# Patient Record
Sex: Female | Born: 1955 | Race: Black or African American | Hispanic: No | Marital: Married | Smoking: Former smoker
Health system: Southern US, Community
[De-identification: ages and names within clinical notes are randomized; demographics above are authoritative.]

## PROBLEM LIST (undated history)

## (undated) DIAGNOSIS — T8859XA Other complications of anesthesia, initial encounter: Secondary | ICD-10-CM

## (undated) DIAGNOSIS — I82409 Acute embolism and thrombosis of unspecified deep veins of unspecified lower extremity: Secondary | ICD-10-CM

## (undated) DIAGNOSIS — H6690 Otitis media, unspecified, unspecified ear: Secondary | ICD-10-CM

## (undated) DIAGNOSIS — L0390 Cellulitis, unspecified: Secondary | ICD-10-CM

## (undated) DIAGNOSIS — G4733 Obstructive sleep apnea (adult) (pediatric): Secondary | ICD-10-CM

## (undated) DIAGNOSIS — Z9989 Dependence on other enabling machines and devices: Secondary | ICD-10-CM

## (undated) DIAGNOSIS — M3500 Sicca syndrome, unspecified: Secondary | ICD-10-CM

## (undated) DIAGNOSIS — I10 Essential (primary) hypertension: Secondary | ICD-10-CM

## (undated) DIAGNOSIS — T4145XA Adverse effect of unspecified anesthetic, initial encounter: Secondary | ICD-10-CM

## (undated) DIAGNOSIS — K219 Gastro-esophageal reflux disease without esophagitis: Secondary | ICD-10-CM

## (undated) DIAGNOSIS — M1990 Unspecified osteoarthritis, unspecified site: Secondary | ICD-10-CM

## (undated) HISTORY — PX: DILATION AND CURETTAGE OF UTERUS: SHX78

## (undated) HISTORY — PX: CARPAL TUNNEL RELEASE: SHX101

## (undated) HISTORY — DX: Sjogren syndrome, unspecified: M35.00

## (undated) HISTORY — DX: Otitis media, unspecified, unspecified ear: H66.90

## (undated) HISTORY — DX: Acute embolism and thrombosis of unspecified deep veins of unspecified lower extremity: I82.409

## (undated) HISTORY — DX: Obstructive sleep apnea (adult) (pediatric): G47.33

## (undated) HISTORY — DX: Essential (primary) hypertension: I10

## (undated) HISTORY — PX: CERVICAL BIOPSY: SHX590

---

## 1987-12-13 DIAGNOSIS — I82409 Acute embolism and thrombosis of unspecified deep veins of unspecified lower extremity: Secondary | ICD-10-CM

## 1987-12-13 HISTORY — DX: Acute embolism and thrombosis of unspecified deep veins of unspecified lower extremity: I82.409

## 1988-12-12 HISTORY — PX: TUBAL LIGATION: SHX77

## 1994-12-12 HISTORY — PX: LAPAROSCOPIC HYSTERECTOMY: SHX1926

## 1994-12-12 HISTORY — PX: ABDOMINAL HYSTERECTOMY: SHX81

## 1994-12-12 HISTORY — PX: BLADDER SURGERY: SHX569

## 1995-12-13 HISTORY — PX: OSTOMY TAKE DOWN: SHX2142

## 1998-06-17 ENCOUNTER — Encounter: Admission: RE | Admit: 1998-06-17 | Discharge: 1998-06-17 | Payer: Self-pay | Admitting: Family Medicine

## 1998-06-29 ENCOUNTER — Encounter: Admission: RE | Admit: 1998-06-29 | Discharge: 1998-06-29 | Payer: Self-pay | Admitting: Family Medicine

## 1998-07-03 ENCOUNTER — Encounter: Admission: RE | Admit: 1998-07-03 | Discharge: 1998-07-03 | Payer: Self-pay | Admitting: Family Medicine

## 1999-01-27 ENCOUNTER — Encounter: Admission: RE | Admit: 1999-01-27 | Discharge: 1999-01-27 | Payer: Self-pay | Admitting: Family Medicine

## 1999-02-25 ENCOUNTER — Ambulatory Visit (HOSPITAL_BASED_OUTPATIENT_CLINIC_OR_DEPARTMENT_OTHER): Admission: RE | Admit: 1999-02-25 | Discharge: 1999-02-25 | Payer: Self-pay | Admitting: Orthopedic Surgery

## 1999-08-13 HISTORY — PX: FOOT SURGERY: SHX648

## 1999-09-01 ENCOUNTER — Encounter: Admission: RE | Admit: 1999-09-01 | Discharge: 1999-09-01 | Payer: Self-pay | Admitting: Family Medicine

## 2002-01-19 ENCOUNTER — Emergency Department (HOSPITAL_COMMUNITY): Admission: EM | Admit: 2002-01-19 | Discharge: 2002-01-19 | Payer: Self-pay | Admitting: Emergency Medicine

## 2002-10-27 ENCOUNTER — Emergency Department (HOSPITAL_COMMUNITY): Admission: EM | Admit: 2002-10-27 | Discharge: 2002-10-27 | Payer: Self-pay | Admitting: Emergency Medicine

## 2002-12-17 ENCOUNTER — Encounter: Admission: RE | Admit: 2002-12-17 | Discharge: 2002-12-17 | Payer: Self-pay | Admitting: Family Medicine

## 2002-12-17 ENCOUNTER — Other Ambulatory Visit: Admission: RE | Admit: 2002-12-17 | Discharge: 2002-12-17 | Payer: Self-pay | Admitting: Family Medicine

## 2002-12-19 ENCOUNTER — Encounter: Admission: RE | Admit: 2002-12-19 | Discharge: 2002-12-19 | Payer: Self-pay | Admitting: Family Medicine

## 2002-12-20 ENCOUNTER — Encounter: Admission: RE | Admit: 2002-12-20 | Discharge: 2002-12-20 | Payer: Self-pay | Admitting: Sports Medicine

## 2002-12-20 ENCOUNTER — Encounter: Payer: Self-pay | Admitting: Sports Medicine

## 2002-12-31 ENCOUNTER — Encounter: Admission: RE | Admit: 2002-12-31 | Discharge: 2002-12-31 | Payer: Self-pay | Admitting: Family Medicine

## 2003-01-01 ENCOUNTER — Encounter: Payer: Self-pay | Admitting: Sports Medicine

## 2003-01-01 ENCOUNTER — Encounter: Admission: RE | Admit: 2003-01-01 | Discharge: 2003-01-01 | Payer: Self-pay | Admitting: Sports Medicine

## 2003-01-09 ENCOUNTER — Encounter: Payer: Self-pay | Admitting: Internal Medicine

## 2003-01-09 ENCOUNTER — Ambulatory Visit (HOSPITAL_BASED_OUTPATIENT_CLINIC_OR_DEPARTMENT_OTHER): Admission: RE | Admit: 2003-01-09 | Discharge: 2003-01-09 | Payer: Self-pay

## 2003-01-09 DIAGNOSIS — G4733 Obstructive sleep apnea (adult) (pediatric): Secondary | ICD-10-CM

## 2003-01-09 HISTORY — DX: Obstructive sleep apnea (adult) (pediatric): G47.33

## 2003-01-14 ENCOUNTER — Encounter: Admission: RE | Admit: 2003-01-14 | Discharge: 2003-01-14 | Payer: Self-pay | Admitting: Family Medicine

## 2003-04-11 ENCOUNTER — Ambulatory Visit (HOSPITAL_BASED_OUTPATIENT_CLINIC_OR_DEPARTMENT_OTHER): Admission: RE | Admit: 2003-04-11 | Discharge: 2003-04-11 | Payer: Self-pay | Admitting: Orthopedic Surgery

## 2003-05-23 ENCOUNTER — Ambulatory Visit (HOSPITAL_BASED_OUTPATIENT_CLINIC_OR_DEPARTMENT_OTHER): Admission: RE | Admit: 2003-05-23 | Discharge: 2003-05-23 | Payer: Self-pay | Admitting: Orthopedic Surgery

## 2003-06-04 ENCOUNTER — Encounter: Payer: Self-pay | Admitting: Internal Medicine

## 2003-06-04 ENCOUNTER — Ambulatory Visit (HOSPITAL_BASED_OUTPATIENT_CLINIC_OR_DEPARTMENT_OTHER): Admission: RE | Admit: 2003-06-04 | Discharge: 2003-06-04 | Payer: Self-pay | Admitting: Internal Medicine

## 2003-07-08 ENCOUNTER — Encounter: Admission: RE | Admit: 2003-07-08 | Discharge: 2003-07-08 | Payer: Self-pay | Admitting: Family Medicine

## 2003-07-08 ENCOUNTER — Encounter: Payer: Self-pay | Admitting: Sports Medicine

## 2003-07-08 ENCOUNTER — Encounter: Admission: RE | Admit: 2003-07-08 | Discharge: 2003-07-08 | Payer: Self-pay | Admitting: Sports Medicine

## 2003-07-16 ENCOUNTER — Encounter: Admission: RE | Admit: 2003-07-16 | Discharge: 2003-07-16 | Payer: Self-pay | Admitting: Family Medicine

## 2003-11-12 HISTORY — PX: CARPAL TUNNEL RELEASE: SHX101

## 2003-11-27 ENCOUNTER — Encounter: Admission: RE | Admit: 2003-11-27 | Discharge: 2003-11-27 | Payer: Self-pay | Admitting: Family Medicine

## 2003-11-28 ENCOUNTER — Ambulatory Visit (HOSPITAL_BASED_OUTPATIENT_CLINIC_OR_DEPARTMENT_OTHER): Admission: RE | Admit: 2003-11-28 | Discharge: 2003-11-28 | Payer: Self-pay | Admitting: Orthopedic Surgery

## 2003-11-28 ENCOUNTER — Encounter (INDEPENDENT_AMBULATORY_CARE_PROVIDER_SITE_OTHER): Payer: Self-pay | Admitting: Specialist

## 2003-11-28 ENCOUNTER — Ambulatory Visit (HOSPITAL_COMMUNITY): Admission: RE | Admit: 2003-11-28 | Discharge: 2003-11-28 | Payer: Self-pay | Admitting: Orthopedic Surgery

## 2003-12-19 ENCOUNTER — Encounter: Admission: RE | Admit: 2003-12-19 | Discharge: 2003-12-19 | Payer: Self-pay | Admitting: Family Medicine

## 2004-02-05 ENCOUNTER — Encounter: Admission: RE | Admit: 2004-02-05 | Discharge: 2004-02-05 | Payer: Self-pay | Admitting: Family Medicine

## 2004-02-19 ENCOUNTER — Encounter: Admission: RE | Admit: 2004-02-19 | Discharge: 2004-02-19 | Payer: Self-pay | Admitting: Sports Medicine

## 2004-04-05 ENCOUNTER — Encounter: Admission: RE | Admit: 2004-04-05 | Discharge: 2004-04-05 | Payer: Self-pay | Admitting: Family Medicine

## 2004-05-27 ENCOUNTER — Encounter: Admission: RE | Admit: 2004-05-27 | Discharge: 2004-05-27 | Payer: Self-pay | Admitting: Family Medicine

## 2005-01-27 ENCOUNTER — Ambulatory Visit: Payer: Self-pay | Admitting: Family Medicine

## 2005-07-13 ENCOUNTER — Ambulatory Visit: Payer: Self-pay | Admitting: Family Medicine

## 2005-07-17 ENCOUNTER — Encounter (INDEPENDENT_AMBULATORY_CARE_PROVIDER_SITE_OTHER): Payer: Self-pay | Admitting: *Deleted

## 2005-07-17 LAB — CONVERTED CEMR LAB

## 2005-08-04 ENCOUNTER — Ambulatory Visit: Payer: Self-pay | Admitting: Sports Medicine

## 2005-08-09 ENCOUNTER — Ambulatory Visit: Payer: Self-pay | Admitting: Sports Medicine

## 2005-10-19 ENCOUNTER — Ambulatory Visit: Payer: Self-pay | Admitting: Family Medicine

## 2005-10-25 ENCOUNTER — Ambulatory Visit: Payer: Self-pay | Admitting: Family Medicine

## 2006-01-05 ENCOUNTER — Ambulatory Visit: Payer: Self-pay | Admitting: Family Medicine

## 2006-02-07 ENCOUNTER — Ambulatory Visit: Payer: Self-pay | Admitting: Sports Medicine

## 2006-02-14 ENCOUNTER — Ambulatory Visit: Payer: Self-pay | Admitting: Family Medicine

## 2006-02-22 ENCOUNTER — Encounter: Admission: RE | Admit: 2006-02-22 | Discharge: 2006-02-22 | Payer: Self-pay | Admitting: *Deleted

## 2006-07-12 HISTORY — PX: SALIVARY GLAND SURGERY: SHX768

## 2006-07-12 HISTORY — PX: MYRINGOTOMY WITH TUBE PLACEMENT: SHX5663

## 2006-08-02 ENCOUNTER — Encounter (INDEPENDENT_AMBULATORY_CARE_PROVIDER_SITE_OTHER): Payer: Self-pay | Admitting: Specialist

## 2006-08-02 ENCOUNTER — Ambulatory Visit (HOSPITAL_COMMUNITY): Admission: RE | Admit: 2006-08-02 | Discharge: 2006-08-02 | Payer: Self-pay | Admitting: Otolaryngology

## 2007-02-08 DIAGNOSIS — G4730 Sleep apnea, unspecified: Secondary | ICD-10-CM

## 2007-02-08 DIAGNOSIS — I10 Essential (primary) hypertension: Secondary | ICD-10-CM

## 2007-02-08 DIAGNOSIS — N951 Menopausal and female climacteric states: Secondary | ICD-10-CM

## 2007-02-08 DIAGNOSIS — R42 Dizziness and giddiness: Secondary | ICD-10-CM | POA: Insufficient documentation

## 2007-02-09 ENCOUNTER — Encounter (INDEPENDENT_AMBULATORY_CARE_PROVIDER_SITE_OTHER): Payer: Self-pay | Admitting: *Deleted

## 2007-02-26 ENCOUNTER — Encounter (INDEPENDENT_AMBULATORY_CARE_PROVIDER_SITE_OTHER): Payer: Self-pay | Admitting: Family Medicine

## 2007-02-26 ENCOUNTER — Ambulatory Visit: Payer: Self-pay | Admitting: Family Medicine

## 2007-02-26 DIAGNOSIS — E785 Hyperlipidemia, unspecified: Secondary | ICD-10-CM

## 2007-02-26 LAB — CONVERTED CEMR LAB
Glucose, Urine, Semiquant: NEGATIVE
Ketones, urine, test strip: NEGATIVE
Urobilinogen, UA: 0.2
WBC Urine, dipstick: NEGATIVE
pH: 5.5

## 2007-02-27 IMAGING — CR DG CHEST 2V
2 series · 2 of 2 positions shown · non-contrast
Comparison: none

CLINICAL DATA: Carotid mass. Chronic otitis media. Sleep apnea. Preoperative chest.
 CHEST - 2 VIEW:

[view not recorded (1 of 2)]
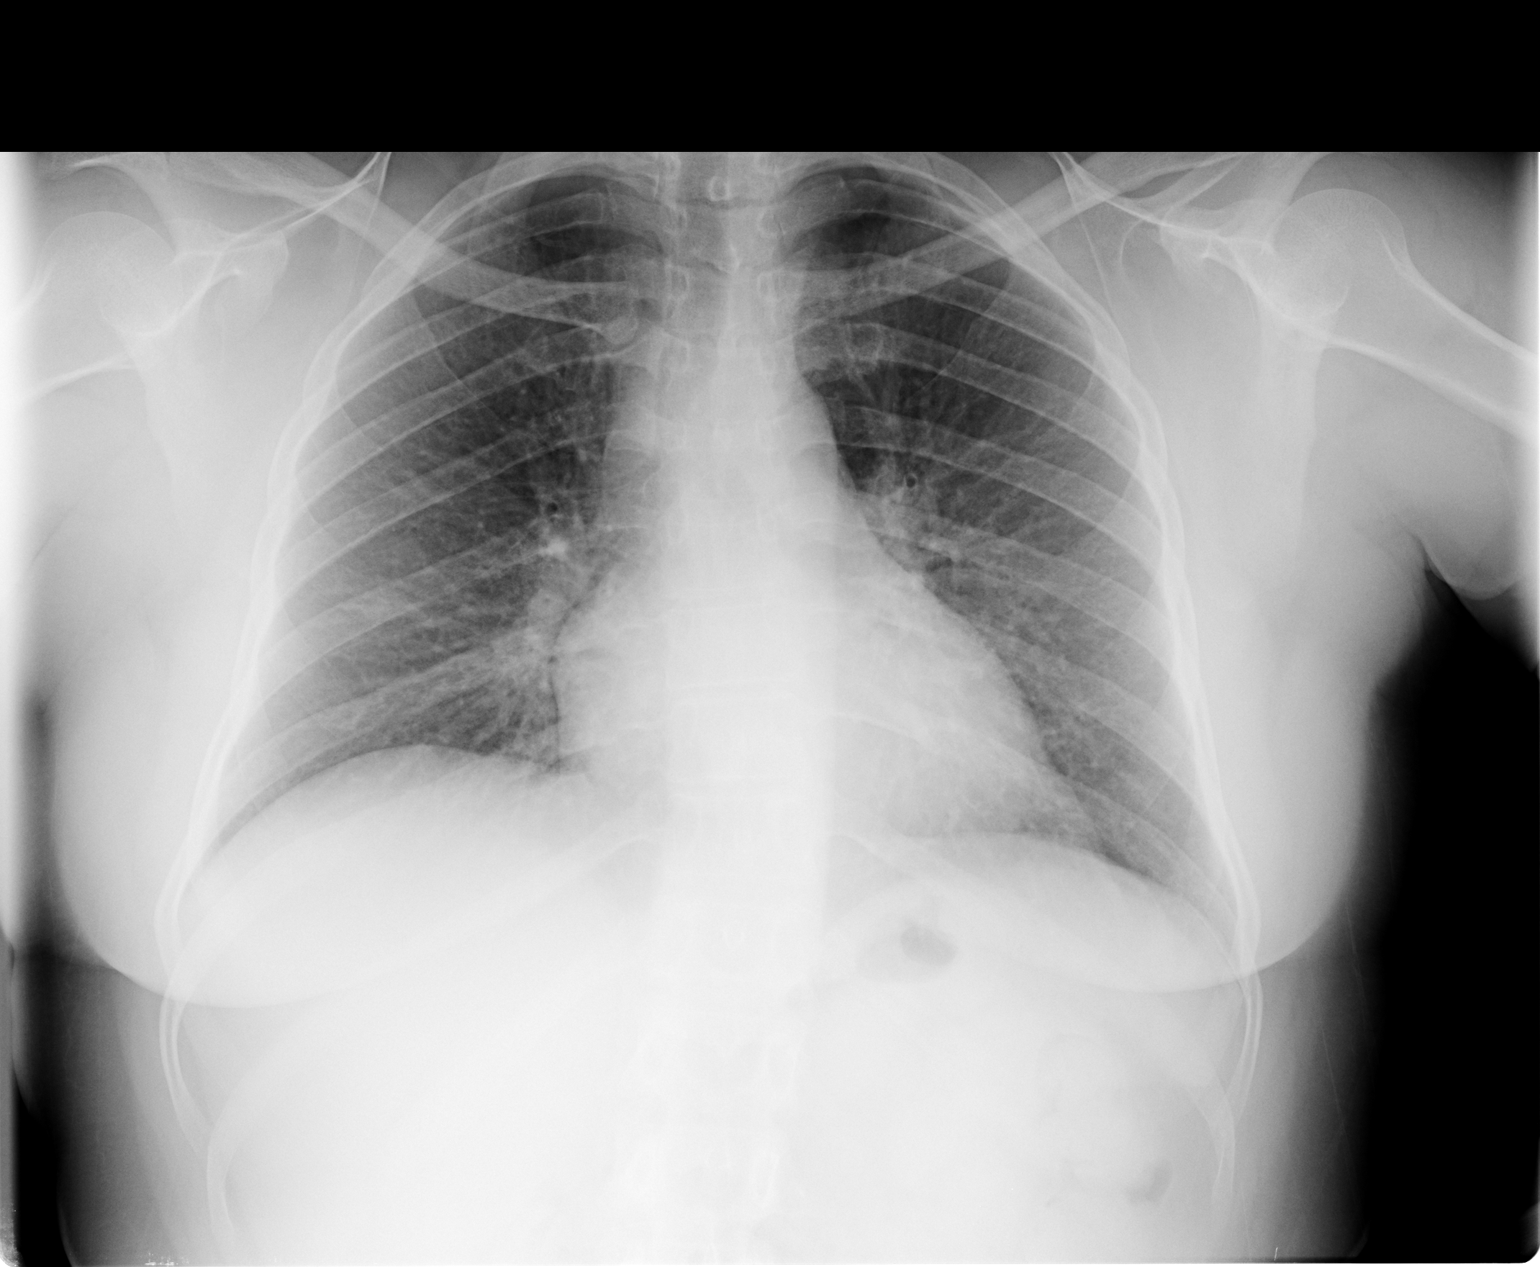

[view not recorded (2 of 2)]
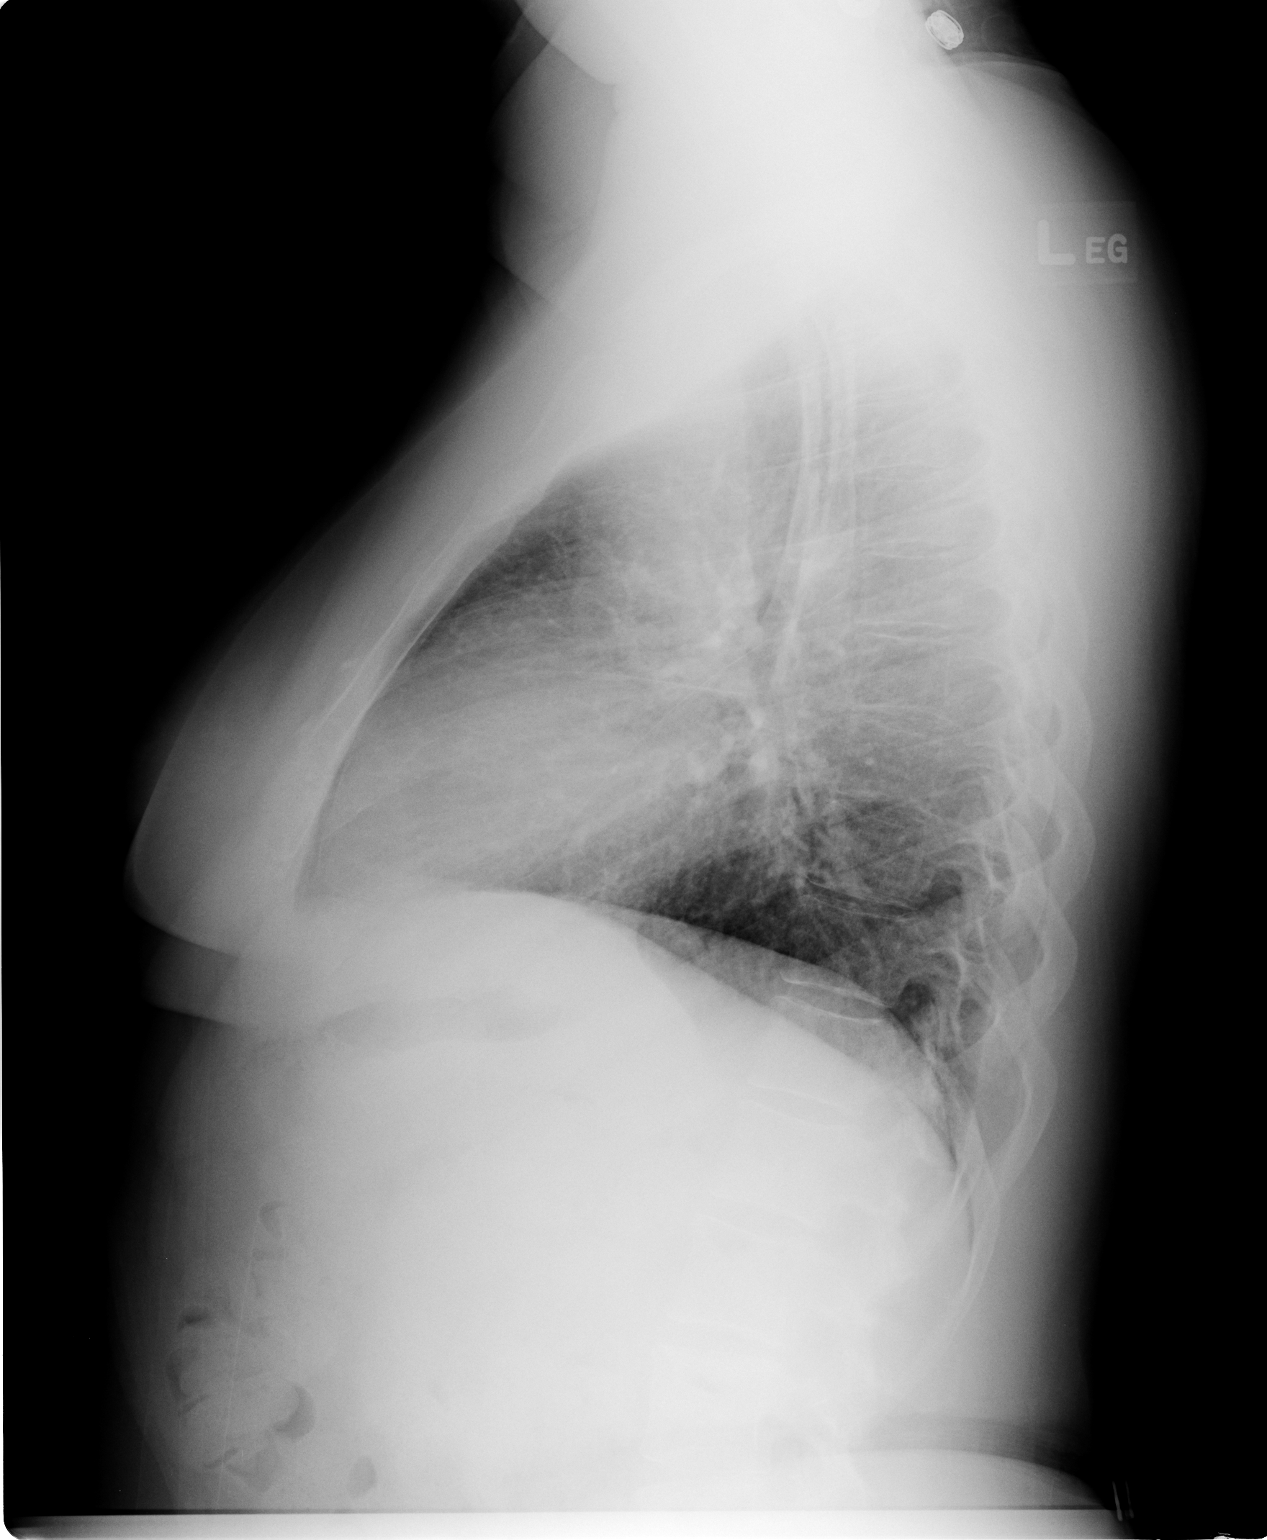

[2 of 2 positions shown; findings below may reference images not displayed]

FINDINGS: There are mildly accentuated bronchial markings. There are no infiltrates.  The heart and mediastinal structures are normal.
IMPRESSION: Mildly accentuated bronchial markings.  No evidence for active chest disease.

## 2007-02-28 ENCOUNTER — Encounter (INDEPENDENT_AMBULATORY_CARE_PROVIDER_SITE_OTHER): Payer: Self-pay | Admitting: Family Medicine

## 2007-02-28 ENCOUNTER — Telehealth (INDEPENDENT_AMBULATORY_CARE_PROVIDER_SITE_OTHER): Payer: Self-pay | Admitting: Family Medicine

## 2007-02-28 LAB — CONVERTED CEMR LAB
AST: 27 units/L (ref 0–37)
Albumin: 4.5 g/dL (ref 3.5–5.2)
Alkaline Phosphatase: 90 units/L (ref 39–117)
BUN: 11 mg/dL (ref 6–23)
Creatinine, Ser: 0.86 mg/dL (ref 0.40–1.20)
Glucose, Bld: 107 mg/dL — ABNORMAL HIGH (ref 70–99)
HDL: 44 mg/dL (ref >39)
LDL Cholesterol: 159 mg/dL — ABNORMAL HIGH (ref 0–99)
Potassium: 4.2 meq/L (ref 3.5–5.3)
Total Bilirubin: 1.1 mg/dL (ref 0.3–1.2)
Total CHOL/HDL Ratio: 5.1
Triglycerides: 110 mg/dL (ref <150)
VLDL: 22 mg/dL (ref 0–40)

## 2007-03-01 ENCOUNTER — Telehealth (INDEPENDENT_AMBULATORY_CARE_PROVIDER_SITE_OTHER): Payer: Self-pay | Admitting: Family Medicine

## 2007-03-13 ENCOUNTER — Ambulatory Visit: Payer: Self-pay | Admitting: Sports Medicine

## 2007-03-13 ENCOUNTER — Encounter (INDEPENDENT_AMBULATORY_CARE_PROVIDER_SITE_OTHER): Payer: Self-pay | Admitting: Family Medicine

## 2007-03-13 LAB — CONVERTED CEMR LAB
CO2: 26 meq/L (ref 19–32)
Calcium: 9.3 mg/dL (ref 8.4–10.5)
Creatinine, Ser: 0.88 mg/dL (ref 0.40–1.20)
Sodium: 142 meq/L (ref 135–145)

## 2007-03-26 ENCOUNTER — Encounter (INDEPENDENT_AMBULATORY_CARE_PROVIDER_SITE_OTHER): Payer: Self-pay | Admitting: Family Medicine

## 2007-03-26 ENCOUNTER — Encounter: Admission: RE | Admit: 2007-03-26 | Discharge: 2007-03-26 | Payer: Self-pay | Admitting: Family Medicine

## 2007-03-28 ENCOUNTER — Encounter (INDEPENDENT_AMBULATORY_CARE_PROVIDER_SITE_OTHER): Payer: Self-pay | Admitting: Family Medicine

## 2007-03-28 LAB — HM MAMMOGRAPHY

## 2007-04-17 ENCOUNTER — Ambulatory Visit: Payer: Self-pay | Admitting: Family Medicine

## 2007-04-18 ENCOUNTER — Telehealth: Payer: Self-pay | Admitting: *Deleted

## 2007-05-29 ENCOUNTER — Telehealth: Payer: Self-pay | Admitting: *Deleted

## 2007-06-05 ENCOUNTER — Encounter (INDEPENDENT_AMBULATORY_CARE_PROVIDER_SITE_OTHER): Payer: Self-pay | Admitting: Family Medicine

## 2007-06-08 ENCOUNTER — Telehealth (INDEPENDENT_AMBULATORY_CARE_PROVIDER_SITE_OTHER): Payer: Self-pay | Admitting: Family Medicine

## 2008-07-18 ENCOUNTER — Ambulatory Visit: Payer: Self-pay | Admitting: Family Medicine

## 2008-07-18 ENCOUNTER — Telehealth: Payer: Self-pay | Admitting: *Deleted

## 2008-07-18 LAB — CONVERTED CEMR LAB
Glucose, Urine, Semiquant: NEGATIVE
Ketones, urine, test strip: NEGATIVE
Nitrite: NEGATIVE
Specific Gravity, Urine: 1.02
pH: 7

## 2008-07-19 ENCOUNTER — Encounter: Payer: Self-pay | Admitting: Family Medicine

## 2008-08-20 ENCOUNTER — Telehealth: Payer: Self-pay | Admitting: *Deleted

## 2008-08-20 ENCOUNTER — Encounter: Payer: Self-pay | Admitting: Family Medicine

## 2008-08-20 ENCOUNTER — Ambulatory Visit: Payer: Self-pay | Admitting: Family Medicine

## 2008-08-20 LAB — CONVERTED CEMR LAB
Bilirubin Urine: NEGATIVE
Ketones, urine, test strip: NEGATIVE
Nitrite: POSITIVE
Protein, U semiquant: NEGATIVE
Urobilinogen, UA: 0.2

## 2008-09-15 ENCOUNTER — Encounter (INDEPENDENT_AMBULATORY_CARE_PROVIDER_SITE_OTHER): Payer: Self-pay | Admitting: Family Medicine

## 2008-10-21 ENCOUNTER — Ambulatory Visit: Payer: Self-pay | Admitting: Family Medicine

## 2008-10-21 ENCOUNTER — Telehealth (INDEPENDENT_AMBULATORY_CARE_PROVIDER_SITE_OTHER): Payer: Self-pay | Admitting: *Deleted

## 2008-10-24 ENCOUNTER — Telehealth (INDEPENDENT_AMBULATORY_CARE_PROVIDER_SITE_OTHER): Payer: Self-pay | Admitting: Family Medicine

## 2008-11-20 ENCOUNTER — Telehealth (INDEPENDENT_AMBULATORY_CARE_PROVIDER_SITE_OTHER): Payer: Self-pay | Admitting: Family Medicine

## 2008-11-20 ENCOUNTER — Emergency Department (HOSPITAL_COMMUNITY): Admission: EM | Admit: 2008-11-20 | Discharge: 2008-11-20 | Payer: Self-pay | Admitting: Emergency Medicine

## 2008-12-22 ENCOUNTER — Telehealth: Payer: Self-pay | Admitting: *Deleted

## 2009-01-13 ENCOUNTER — Telehealth: Payer: Self-pay | Admitting: *Deleted

## 2009-01-13 ENCOUNTER — Ambulatory Visit: Payer: Self-pay | Admitting: Family Medicine

## 2009-01-13 DIAGNOSIS — M1990 Unspecified osteoarthritis, unspecified site: Secondary | ICD-10-CM

## 2009-02-05 ENCOUNTER — Telehealth: Payer: Self-pay | Admitting: *Deleted

## 2009-04-09 ENCOUNTER — Ambulatory Visit: Payer: Self-pay | Admitting: Internal Medicine

## 2009-05-22 ENCOUNTER — Ambulatory Visit: Payer: Self-pay | Admitting: Internal Medicine

## 2009-06-03 ENCOUNTER — Encounter: Payer: Self-pay | Admitting: Internal Medicine

## 2009-06-10 ENCOUNTER — Telehealth: Payer: Self-pay | Admitting: Internal Medicine

## 2009-09-14 ENCOUNTER — Encounter: Payer: Self-pay | Admitting: Family Medicine

## 2009-09-14 ENCOUNTER — Ambulatory Visit: Payer: Self-pay | Admitting: Family Medicine

## 2009-09-14 LAB — CONVERTED CEMR LAB
ALT: 34 units/L (ref 0–35)
AST: 26 units/L (ref 0–37)
Albumin: 4.7 g/dL (ref 3.5–5.2)
Alkaline Phosphatase: 112 units/L (ref 39–117)
BUN: 12 mg/dL (ref 6–23)
CO2: 24 meq/L (ref 19–32)
Calcium: 9.9 mg/dL (ref 8.4–10.5)
Chloride: 104 meq/L (ref 96–112)
Creatinine, Ser: 0.84 mg/dL (ref 0.40–1.20)
Glucose, Bld: 98 mg/dL (ref 70–99)
HCT: 41.5 % (ref 36.0–46.0)
Hemoglobin: 13.7 g/dL (ref 12.0–15.0)
Lipase: 16 units/L (ref 0–75)
MCHC: 33 g/dL (ref 30.0–36.0)
MCV: 87.6 fL (ref 78.0–100.0)
Platelets: 298 10*3/uL (ref 150–400)
Potassium: 4.2 meq/L (ref 3.5–5.3)
RBC: 4.74 M/uL (ref 3.87–5.11)
RDW: 13.4 % (ref 11.5–15.5)
Sodium: 141 meq/L (ref 135–145)
Total Bilirubin: 1.7 mg/dL — ABNORMAL HIGH (ref 0.3–1.2)
Total Protein: 8.2 g/dL (ref 6.0–8.3)
WBC: 9.9 10*3/uL (ref 4.0–10.5)

## 2009-09-16 ENCOUNTER — Telehealth: Payer: Self-pay | Admitting: *Deleted

## 2009-09-16 ENCOUNTER — Encounter: Payer: Self-pay | Admitting: Family Medicine

## 2009-10-01 ENCOUNTER — Encounter: Payer: Self-pay | Admitting: Family Medicine

## 2009-10-05 ENCOUNTER — Encounter: Payer: Self-pay | Admitting: Family Medicine

## 2009-10-05 LAB — HM COLONOSCOPY

## 2009-10-09 ENCOUNTER — Telehealth: Payer: Self-pay | Admitting: Family Medicine

## 2010-01-28 ENCOUNTER — Telehealth: Payer: Self-pay | Admitting: Family Medicine

## 2010-01-28 ENCOUNTER — Ambulatory Visit: Payer: Self-pay | Admitting: Family Medicine

## 2010-05-14 ENCOUNTER — Ambulatory Visit: Payer: Self-pay | Admitting: Family Medicine

## 2010-05-14 ENCOUNTER — Encounter: Payer: Self-pay | Admitting: Family Medicine

## 2010-05-14 DIAGNOSIS — M3500 Sicca syndrome, unspecified: Secondary | ICD-10-CM

## 2010-05-14 LAB — CONVERTED CEMR LAB
CO2: 27 meq/L (ref 19–32)
Calcium: 9.7 mg/dL (ref 8.4–10.5)
Creatinine, Ser: 0.75 mg/dL (ref 0.40–1.20)

## 2010-05-24 ENCOUNTER — Encounter: Payer: Self-pay | Admitting: Family Medicine

## 2010-05-24 ENCOUNTER — Ambulatory Visit: Payer: Self-pay | Admitting: Family Medicine

## 2010-05-24 LAB — CONVERTED CEMR LAB
Chloride: 101 meq/L (ref 96–112)
Creatinine, Ser: 0.85 mg/dL (ref 0.40–1.20)
Potassium: 3.8 meq/L (ref 3.5–5.3)
Sodium: 140 meq/L (ref 135–145)

## 2010-05-25 ENCOUNTER — Encounter: Payer: Self-pay | Admitting: Family Medicine

## 2010-06-29 ENCOUNTER — Telehealth: Payer: Self-pay | Admitting: Family Medicine

## 2010-06-30 ENCOUNTER — Telehealth: Payer: Self-pay | Admitting: *Deleted

## 2010-08-23 ENCOUNTER — Encounter: Payer: Self-pay | Admitting: Family Medicine

## 2010-08-23 ENCOUNTER — Ambulatory Visit: Payer: Self-pay | Admitting: Family Medicine

## 2010-09-08 ENCOUNTER — Ambulatory Visit: Payer: Self-pay | Admitting: Family Medicine

## 2010-09-08 LAB — CONVERTED CEMR LAB: Whiff Test: POSITIVE

## 2010-09-21 ENCOUNTER — Telehealth: Payer: Self-pay | Admitting: Family Medicine

## 2010-10-15 ENCOUNTER — Encounter: Payer: Self-pay | Admitting: Family Medicine

## 2011-01-11 NOTE — Progress Notes (Signed)
  Medications Added HYDROCHLOROTHIAZIDE 25 MG TABS (HYDROCHLOROTHIAZIDE) 1 tab by mouth daily       Phone Note Call from Patient   Caller: Patient Call For: [phone] or [phone] Summary of Call: Pt need he orginal dosage of her HCTZ 25 mg to be represcribed because the 50mg  she believes is causing  severe dryness to her eyes.  She suffer with sjogrens disease and the higher dosage of the HCTZ is causing problems.   Initial call taken by: Abundio Miu,  September 21, 2010 9:43 AM    New/Updated Medications: HYDROCHLOROTHIAZIDE 25 MG TABS (HYDROCHLOROTHIAZIDE) 1 tab by mouth daily Prescriptions: HYDROCHLOROTHIAZIDE 25 MG TABS (HYDROCHLOROTHIAZIDE) 1 tab by mouth daily  #30 x 3   Entered and Authorized by:   Angelena Sole MD   Signed by:   Angelena Sole MD on 09/22/2010   Method used:   Electronically to        [facility] (retail)       [address]       [place], [place]  [id number]       Ph: [phone]       Fax: [phone]   RxID:   [id number]   Appended Document:  Pt informed

## 2011-01-11 NOTE — Assessment & Plan Note (Signed)
Summary: female problem/bp problems,tcb   Vital Signs:  Patient profile:   55 year old female Height:      61.25 inches Weight:      183 pounds BMI:     34.42 BSA:     1.83 Temp:     98.2 degrees F Pulse rate:   90 / minute BP sitting:   154 / 80  Vitals Entered By: Jone Baseman CMA (September 08, 2010 11:17 AM) CC: female problem/ BP problems Is Patient Diabetic? No Pain Assessment Patient in pain? no        Primary Care Provider:  Angelena Sole MD  CC:  female problem/ BP problems.  History of Present Illness: 1. Vaginal bleeding: - Noticed this for the past couple of months - She has noticed a small amount of bleeding after intercourse 2-3 times in the past couple of months. - It doesn't happen everytime  ROS: endorses some vaginal dryness.  Denies vaginal discharge.  Denies dysuria, burning, or frequency  PSHx:  She had a hysterectomy for fibroids around 10 years ago  2. HTN: - Taking and tolerating medicines as presribed - Would like to switch to a different medication because the Diovan is so expensive  ROS: denies chest pain, shortness of breath.  Endorses being under a lot of stress with her taking care of her niece he just moved in with her because she was having so many problems at home.  Habits & Providers  Alcohol-Tobacco-Diet     Tobacco Status: quit > 6 months  Current Medications (verified): 1)  B-12 500 Mcg Tabs (Cyanocobalamin) .... Once Daily 2)  Eql Fish Oil 1000 Mg Caps (Omega-3 Fatty Acids) .... Once Daily 3)  Cpap Advanced 12 Cwp 4)  Amlodipine Besylate 5 Mg Tabs (Amlodipine Besylate) .Marland Kitchen.. 1 Tab By Mouth Daily 5)  Flonase 50 Mcg/act Susp (Fluticasone Propionate) .Marland Kitchen.. 1 Spray Each Nostril Daily 6)  Hydrochlorothiazide 50 Mg Tabs (Hydrochlorothiazide) .Marland Kitchen.. 1 Tab By Mouth Daily 7)  Flagyl 500 Mg Tabs (Metronidazole) .Marland Kitchen.. 1 Tab By Mouth Twice A Day For 7 Days  Allergies (verified): 1)  ! Lisinopril (Lisinopril)  Social  History: Reviewed history from 02/26/2007 and no changes required. Lives w/ 2 kids, husband, mom.  Works as Insurance underwriter.  Quit smoking 6 yrs ago.  Denies ETOH.  Is taking care of her niece because of behavior issues at home.  Physical Exam  General:  Vital signs reviewed NAD Neck:  supple and no masses.   Lungs:  CTAB Heart:  RRR no murmur, no carotid bruit appreciated Genitalia:  Atrophic vaginits.  White vaginal discharge.  Vaginal mucosa is pink and moist.  No lesions, tears, or other sites of bleeding.  No cervix seen.   Impression & Recommendations:  Problem # 1:  UNSPECIFIED VAGINITIS AND VULVOVAGINITIS (ICD-616.10) Assessment New + wet prep will treat with Flagyl.  She also has atrophic vaginitis so advised her to use lubrication with intercourse.  She may benefit from an vaginal estrogen cream. The following medications were removed from the medication list:    Keflex 500 Mg Caps (Cephalexin) ..... One tablet twice a day for 7 days Her updated medication list for this problem includes:    Flagyl 500 Mg Tabs (Metronidazole) .Marland Kitchen... 1 tab by mouth twice a day for 7 days  Orders: Wet Prep- [facility] (87210) [facility]- Est  Level 4 ([address]109)  Problem # 2:  HYPERTENSION, BENIGN SYSTEMIC (ICD-401.1) Assessment: Unchanged  Not at goal but is having trouble  affording her Diovan.  Will switch to generics (Amlodipine and HCTZ) to save her some money.  May need to add on BB.  If not able to control may need to go back to Diovan. Her updated medication list for this problem includes:    Amlodipine Besylate 5 Mg Tabs (Amlodipine besylate) .Marland Kitchen... 1 tab by mouth daily    Hydrochlorothiazide 50 Mg Tabs (Hydrochlorothiazide) .Marland Kitchen... 1 tab by mouth daily  Orders: [facility]- Est  Level 4 ([address]214)  Complete Medication List: 1)  B-12 500 Mcg Tabs (Cyanocobalamin) .... Once daily 2)  Eql Fish Oil 1000 Mg Caps (Omega-3 fatty acids) .... Once daily 3)  Cpap Advanced 12 Cwp  4)  Amlodipine Besylate 5 Mg Tabs  (Amlodipine besylate) .Marland Kitchen.. 1 tab by mouth daily 5)  Flonase 50 Mcg/act Susp (Fluticasone propionate) .Marland Kitchen.. 1 spray each nostril daily 6)  Hydrochlorothiazide 50 Mg Tabs (Hydrochlorothiazide) .Marland Kitchen.. 1 tab by mouth daily 7)  Flagyl 500 Mg Tabs (Metronidazole) .Marland Kitchen.. 1 tab by mouth twice a day for 7 days  Patient Instructions: 1)  We will check to make sure that you don't have a vaginal infection 2)  I will let you know of that result 3)  I didn't see anything that could be causing your bleeding 4)  You did have some vaginal dryness that could be related 5)  Try and use lubrication with all intercourse 6)  If that doesn't help we may want to consider a vaginal estrogen cream 7)  I am going to switch you to a different blood pressure medicine to save you some money 8)  Please come back and see me in 4-6 weeks to check on your blood pressure Prescriptions: FLAGYL 500 MG TABS (METRONIDAZOLE) 1 tab by mouth twice a day for 7 days  #14 x 0   Entered and Authorized by:   Angelena Sole MD   Signed by:   Angelena Sole MD on 09/08/2010   Method used:   Electronically to        [facility] (retail)       [address]       [place], [place]  [id number]       Ph: [phone]       Fax: [phone]   RxID:   [id number] HYDROCHLOROTHIAZIDE 50 MG TABS (HYDROCHLOROTHIAZIDE) 1 tab by mouth daily  #30 x 3   Entered and Authorized by:   Angelena Sole MD   Signed by:   Angelena Sole MD on 09/08/2010   Method used:   Electronically to        [facility] (retail)       [address]       [place], [place]  [address]       Ph: [phone]       Fax: [phone]   RxID:   [id number] AMLODIPINE BESYLATE 5 MG TABS (AMLODIPINE BESYLATE) 1 tab by mouth daily  #30 x 3   Entered and Authorized by:   Angelena Sole MD   Signed by:   Angelena Sole MD on 09/08/2010   Method used:   Electronically to        [facility]  (retail)       [address]       [place], [place]  [address]       Ph: [phone]       Fax: [phone]   RxID:   [id number]   Laboratory Results  Date/Time Received: September 08, 2010 12:14 PM  Date/Time Reported: September 08, 2010 12:17 PM   [employer] Source: vaginal WBC/hpf: 5-10 Bacteria/hpf: 3+  Cocci Clue cells/hpf: few  Positive whiff Yeast/hpf: none Trichomonas/hpf: none Comments: 1-5 RBC's present ...........test performed by...........Marland KitchenTerese Door, CMA

## 2011-01-11 NOTE — Miscellaneous (Signed)
Summary: walk in   Clinical Lists Changes came in c/o UTI symptoms since late Thursday. burning with urination & pain. has tried cranberry juice & lots of fluids. placed in work in & VS obtained.Golden Circle RN  August 23, 2010 9:25 AM

## 2011-01-11 NOTE — Progress Notes (Signed)
Summary: triage   Phone Note Call from Patient Call back at [phone]   Caller: Patient Summary of Call: Pt has been throwing up and had diarrhea for past 2 days and not getting better.  Can she be seen today? Initial call taken by: Clydell Hakim,  January 28, 2010 10:13 AM  Follow-up for Phone Call        has not tried anything for her complaints. to come at 11am work in. aware of wait Follow-up by: Golden Circle RN,  January 28, 2010 10:14 AM

## 2011-01-11 NOTE — Letter (Signed)
Summary: Generic Letter  [facility]  [address]   [place], [place] [address]   Phone: [phone]  Fax: [phone]    05/25/2010  [place] [address] [place], [place]  [address]  Dear [name],  Here is a copy of your lab results.  Your kidney function is normal.  Tests: (1) Basic Metabolic Panel ([address]34)   Sodium                    140 mEq/L                   135-145   Potassium                 3.8 mEq/L                   3.5-5.3   Chloride                  101 mEq/L                   96-112   CO2                       26 mEq/L                    19-32   Glucose              [H]  114 mg/dL                   74-25   BUN                       16 mg/dL                    9-56   Creatinine                0.85 mg/dL                  0.40-1.20   Calcium                   9.8 mg/dL                   3.8-75.6   Sincerely,   Angelena Sole MD  Appended Document: Generic Letter mailed.

## 2011-01-11 NOTE — Miscellaneous (Signed)
   Clinical Lists Changes  Problems: Removed problem of UNSPECIFIED VAGINITIS AND VULVOVAGINITIS (ICD-616.10) Removed problem of CHEST PAIN (ICD-786.50) Removed problem of OTITIS EXTERNA (ICD-380.10) Removed problem of BLOOD IN STOOL, OCCULT (ICD-792.1) Removed problem of NAUSEA WITH VOMITING (ICD-787.01) Removed problem of INSECT BITE (ICD-919.4) Removed problem of UTI'S, RECURRENT (ICD-599.0) Removed problem of DYSURIA (ICD-788.1) Removed problem of PROBLEMS W/EYE NEC (ICD-V41.1) Removed problem of BACK PAIN (ICD-724.5) Removed problem of FOLLOW-UP, HIGH RISK TREATMENT NEC (ICD-V67.51) Removed problem of SCREENING MAMMOGRAM NEC (ICD-V76.12) Removed problem of SYMPTOM, URGENCY, URINATION (AOZ-308.65) Removed problem of SINUSITIS, CHRONIC, NOS (ICD-473.9) Removed problem of CARPAL TUNNEL SYNDROME (ICD-354.0)

## 2011-01-11 NOTE — Assessment & Plan Note (Signed)
Summary: uti per pt/[place]/saunders   Vital Signs:  Patient profile:   55 year old female Weight:      184 pounds Temp:     98.4 degrees F oral Pulse rate:   77 / minute BP sitting:   133 / 83  (right arm) Cuff size:   regular  Vitals Entered By: Arlyss Repress CMA, (August 23, 2010 9:34 AM) CC: dysuria and pressure with urination x 4 days. pt has hx of uti's and had bladder surgery few years ago. Is Patient Diabetic? No Pain Assessment Patient in pain? no        Primary Care Provider:  Angelena Sole MD  CC:  dysuria and pressure with urination x 4 days. pt has hx of uti's and had bladder surgery few years ago.Marland Kitchen  History of Present Illness: 55 yo with remote history of frequent urinary tract infections after bladder repair during a rupture with transvaginal hysterectomy at age 39, but now no recent UTI's.  + dysuria, urinary frequency  - fever, chills, pelvic pain, back pain, vaginal discharge, n/v  Habits & Providers  Alcohol-Tobacco-Diet     Tobacco Status: quit > 6 months  Current Medications (verified): 1)  B-12 500 Mcg Tabs (Cyanocobalamin) .... Once Daily 2)  Eql Fish Oil 1000 Mg Caps (Omega-3 Fatty Acids) .... Once Daily 3)  Cpap Advanced 12 Cwp 4)  Diovan Hct 80-12.5 Mg Tabs (Valsartan-Hydrochlorothiazide) .... Take 1 Tab By Mouth Once Daily 5)  Ciprofloxacin Hcl 500 Mg Tabs (Ciprofloxacin Hcl) .Marland Kitchen.. 1 Tab By Mouth Two Times A Day For 10 Days 6)  Flonase 50 Mcg/act Susp (Fluticasone Propionate) .Marland Kitchen.. 1 Spray Each Nostril Daily  Allergies: 1)  ! Lisinopril (Lisinopril) PMH-FH-SH reviewed for relevance  Social History: Smoking Status:  quit > 6 months  Review of Systems      See HPI  Physical Exam  General:  Vital signs reviewed NAD Abdomen:  no Abd pain, no flank pain   Impression & Recommendations:  Problem # 1:  DYSURIA (ICD-788.1) Last UTI is office was pan sensetive E. Coli.  Will treat today with keflex and follow-up with urine  culture.  The following medications were removed from the medication list:    Ciprofloxacin Hcl 500 Mg Tabs (Ciprofloxacin hcl) .Marland Kitchen... 1 tab by mouth two times a day for 10 days Her updated medication list for this problem includes:    Keflex 500 Mg Caps (Cephalexin) ..... One tablet twice a day for 7 days  Orders: Urinalysis-[facility] (00000) [facility]- Est Level  3 (88416)  Complete Medication List: 1)  B-12 500 Mcg Tabs (Cyanocobalamin) .... Once daily 2)  Eql Fish Oil 1000 Mg Caps (Omega-3 fatty acids) .... Once daily 3)  Cpap Advanced 12 Cwp  4)  Diovan Hct 80-12.5 Mg Tabs (Valsartan-hydrochlorothiazide) .... Take 1 tab by mouth once daily 5)  Flonase 50 Mcg/act Susp (Fluticasone propionate) .Marland Kitchen.. 1 spray each nostril daily 6)  Keflex 500 Mg Caps (Cephalexin) .... One tablet twice a day for 7 days  Patient Instructions: 1)  Antibiotic- Keflex for urinary tract infection. 2)  Finish all antibiotics even if you feel better 3)  Drink lots of water. 4)  Return if fever, worsening pain, no improvement after antibiotics. Prescriptions: KEFLEX 500 MG CAPS (CEPHALEXIN) one tablet twice a day for 7 days  #14 x 0   Entered and Authorized by:   Delbert Harness MD   Signed by:   Delbert Harness MD on 08/23/2010   Method used:  Electronically to        [facility] (retail)       [address]       [place], [place]  [id number]       Ph: [phone]       Fax: [phone]   RxID:   [id number]   Appended Document:      Clinical Lists Changes  Orders: Added new Test order of Urine Culture-[facility] [phone]) - Signed      Appended Document: u micro report     Lab Visit  Laboratory Results   Urine Tests  Date/Time Received: August 23, 2010 9:47 AM  Date/Time Reported: August 23, 2010 10:22 AM   Routine Urinalysis   Color: yellow Appearance: Clear Glucose: negative   (Normal Range: Negative) Bilirubin: negative   (Normal Range:  Negative) Ketone: negative   (Normal Range: Negative) Spec. Gravity: >=1.030   (Normal Range: 1.003-1.035) Blood: small   (Normal Range: Negative) pH: 5.5   (Normal Range: 5.0-8.0) Protein: negative   (Normal Range: Negative) Urobilinogen: 0.2   (Normal Range: 0-1) Nitrite: negative   (Normal Range: Negative) Leukocyte Esterace: small   (Normal Range: Negative)  Urine Microscopic WBC/HPF: TNTC RBC/HPF: 1-5 Bacteria/HPF: 3+ Epithelial/HPF: 10-20    Comments: urine sent for culture ...............test performed by......Marland KitchenBonnie A. [place], MLS (ASCP)cm  Date/Time Received:  Date/Time Reported:   Orders Today:

## 2011-01-11 NOTE — Assessment & Plan Note (Signed)
Summary: elevated BP,df   Vital Signs:  Patient profile:   55 year old female Height:      61.25 inches Weight:      187.3 pounds BMI:     35.23 Temp:     98.1 degrees F oral Pulse rate:   90 / minute BP sitting:   173 / 108  (left arm) Cuff size:   regular  Vitals Entered By: Garen Grams LPN (May 14, 1609 4:25 PM) CC: elevated bp, out of meds Is Patient Diabetic? No Pain Assessment Patient in pain? yes     Location: headache   Primary Care Provider:  Angelena Sole MD  CC:  elevated bp and out of meds.  History of Present Illness: 55 yo female here with headache and elevated BP.  Pt has been without meds for > 6 months and even before that pt was only taking dandaliion root.  t states for the past week she has had increase in headache, no visioin changes but has noticed some swelling of her lower extremities.  Pt checked her blood pressure the last two days and it has been elevated with SBP 150's to 190's quite regularly and decided she needed to be on medication.  Pt denies any chest pain but does states that with increase activity, such as when she is at the gym she becomes short of breath quicker than she usually does.  Pt states the SOB seems to improve with rest but once again denis CP.   Pt states in the past she was on HCTZ but did not like the high dose and pt was on lisinopril and had a cough and that is why she quit taking them.    Pt does states she has had a sore throat and has had a little sore throat but denies fever, chills, nausea, vomiting, diarrhea or constipation   Habits & Providers  Alcohol-Tobacco-Diet     Tobacco Status: never  Current Medications (verified): 1)  B-12 500 Mcg Tabs (Cyanocobalamin) .... Once Daily 2)  Eql Fish Oil 1000 Mg Caps (Omega-3 Fatty Acids) .... Once Daily 3)  Cpap Advanced 12 Cwp 4)  Diovan Hct 80-12.5 Mg Tabs (Valsartan-Hydrochlorothiazide) .... Take 1 Tab By Mouth Once Daily 5)  Ciprofloxacin Hcl 500 Mg Tabs  (Ciprofloxacin Hcl) .Marland Kitchen.. 1 Tab By Mouth Two Times A Day For 10 Days  Allergies (verified): 1)  ! Lisinopril (Lisinopril)  Past History:  Past medical, surgical, family and social histories (including risk factors) reviewed, and no changes noted (except as noted below).  Past Medical History: Reviewed history from 04/09/2009 and no changes required. Obstructive Sleep apnea- NPSG 01/09/03 AHI 79/ hr Sjogrens' Syndrome- cervical node bx Dr Annalee Genta  Past Surgical History: Reviewed history from 04/09/2009 and no changes required. c-sections x 2 - Carpel tunnel syndrome repair bilaterally - 03/13/2003 exploratory laparatomy - hysterectomy for fibroids - 12/12/1994 (bladder rupture complication) sleep study - severe OSA - 12/17/2002 Cervical node bx  Family History: Reviewed history from 02/26/2007 and no changes required. 1 brother died age 33 to heart/valvular dz. 1 sister died age 28 from melanoma/sarcoma. 2 brothers/2 sisters alive and well. Aunt w/ DM, cancer. Dad died age 38 from dementia. GF died of colon CA. GM died of dementia. Mom alive and well.  Social History: Reviewed history from 02/26/2007 and no changes required. Lives w/ 2 kids, husband, mom.  Works as Insurance underwriter.  Quit smoking 6 yrs ago.  Denies ETOH.  Exercises by walking and aerobics  2xweek.  Review of Systems       denies fever, chills, nausea, vomiting, diarrhea or constipation cp +DOE and lower extreemity swelling  Physical Exam  General:  no acute distress interactive Ears:  right sided tympostomy tube, moderate yeast in external canal Nose:  turbinates red and swollen Mouth:  mmm, pnd Neck:  mild anterior chain node swelling, tender Lungs:  CTAB Heart:  RRR no murmur no carotid bruit appreciated Abdomen:  BS +, NT, ND, no bruit Extremities:  trace edema LE b/l non pitting.     Impression & Recommendations:  Problem # 1:  HYPERTENSION, BENIGN SYSTEMIC (ICD-401.1) Pt does have elevated BP  today, symptomatic also has otitis externa which could be causing headache.  Will start with low dose diovan, pt states that she could not tolerate high dose HCTZ and had cough with lisinopril.  Will get BMEt today and then f/u next week for more blood work.  Pt gives concerning hx of possible angina without cp  that needs to be worked up pt though declined work up at this vissit, pt was told the risk but becaus eof no cp did not feel it was relevant but states she will f/u next week when she has more time.   Her updated medication list for this problem includes:    Diovan Hct 80-12.5 Mg Tabs (Valsartan-hydrochlorothiazide) .Marland Kitchen... Take 1 tab by mouth once daily  Orders: Basic Met-[facility] ([address]) [facility]- Est Level  3 (09811)  Problem # 2:  OTITIS EXTERNA (ICD-380.10) Pt states she was recently treated with topical drops, did work some but pt does have a tympostomy tube in will tx with systemic and will get BMET and check sugar.  At follow up may need CBG to make sure no DM.  May need to f/u with her ENT in near future.  Will tx with 10 days of Cipro,  [facility]- Est Level  3 ([address]213)  Problem # 3:  SJOGREN'S SYNDROME, HX OF (ICD-V13.8) Pt stats been recently dignosed  need to look at records  Problem # 4:  ? typical Angina THIS NEEDS TO BE WORKED UP NEXT WEEK AT FOLLOW UP APPOINTMENT.  NEEDS EKG and LIKELY STRESS TEST  Problem # 5:  weight gain when reviewing the chart in the last 8 months pt haas gained well over 15#.  Will consider getting TSH at f/u due to hx of autoimmune dx in hx as well.    Complete Medication List: 1)  B-12 500 Mcg Tabs (Cyanocobalamin) .... Once daily 2)  Eql Fish Oil 1000 Mg Caps (Omega-3 fatty acids) .... Once daily 3)  Cpap Advanced 12 Cwp  4)  Diovan Hct 80-12.5 Mg Tabs (Valsartan-hydrochlorothiazide) .... Take 1 tab by mouth once daily 5)  Ciprofloxacin Hcl 500 Mg Tabs (Ciprofloxacin hcl) .Marland Kitchen.. 1 tab by mouth two times a day for 10 days 6)  Flonase 50 Mcg/act Susp  (Fluticasone propionate) .Marland Kitchen.. 1 spray each nostril daily  Patient Instructions: 1)  Nice to meet you 2)  I am giving you a new medication for your blood pressure.  It is called valsartan and HCTZ.  Take 1 tab daily 3)  I am drawing blood today 4)  If you start to have shortness of breath chest pain or swelling in your legs is worse please go to urgent care or the hospital. 5)  Please see Dr. Lelon Perla next week Prescriptions: FLONASE 50 MCG/ACT SUSP (FLUTICASONE PROPIONATE) 1 spray each nostril daily  #1 bottle x 3  Entered and Authorized by:   Antoine Primas DO   Signed by:   Antoine Primas DO on 05/15/2010   Method used:   Historical   RxID:   [id number] CIPROFLOXACIN HCL 500 MG TABS (CIPROFLOXACIN HCL) 1 tab by mouth two times a day for 10 days  #20 x 1   Entered and Authorized by:   Antoine Primas DO   Signed by:   Antoine Primas DO on 05/14/2010   Method used:   Electronically to        [facility] ([facility]ail)       [address]       [place], [place]  [address]       Ph: [phone]       Fax: [phone]   RxID:   [id number] FLUTICASONE PROPIONATE 50 MCG/ACT SUSP (FLUTICASONE PROPIONATE) 1-2 sprays each nostril, once or twice daily  #1 bottle x 6   Entered and Authorized by:   Antoine Primas DO   Signed by:   Antoine Primas DO on 05/14/2010   Method used:   Electronically to        [facility] (retail)       [address]       [place], [place]  [id number]       Ph: [phone]       Fax: [phone]   RxID:   [id number] DIOVAN HCT 80-12.5 MG TABS (VALSARTAN-HYDROCHLOROTHIAZIDE) take 1 tab by mouth once daily  #32 x 3   Entered and Authorized by:   Antoine Primas DO   Signed by:   Antoine Primas DO on 05/14/2010   Method used:   Electronically to        [facility] (retail)       [address]       [place], [place]  [address]       Ph: [phone]       Fax: [phone]   RxID:    [id number]

## 2011-01-11 NOTE — Assessment & Plan Note (Signed)
Summary: n & v, diarrhea/Evans/Saunders   Vital Signs:  Patient profile:   55 year old female Height:      61.25 inches Weight:      180 pounds BMI:     33.86 BSA:     1.81 Temp:     98.4 degrees F Pulse rate:   84 / minute BP sitting:   156 / 99  Vitals Entered By: Jone Baseman CMA (January 28, 2010 11:21 AM) CC: N/V and Diarrhea x 4 days Is Patient Diabetic? No Pain Assessment Patient in pain? yes     Location: stomach   Primary Care Provider:  Angelena Sole MD  CC:  N/V and Diarrhea x 4 days.  History of Present Illness: Nausea vomiting and diarrhea for the last 4 days.  Is exposed to many people.  No fever or bleeding.  Does have cramps with diarrhea.  Took peptobismol which may have helped.  Urinated an hour ago.  Drinking ginger ale well  ROS - as above PMH - Medications reviewed and updated in medication list.  Smoking Status noted in VS form      Habits & Providers  Alcohol-Tobacco-Diet     Tobacco Status: never  Current Medications (verified): 1)  B-12 500 Mcg Tabs (Cyanocobalamin) .... Once Daily 2)  Eql Fish Oil 1000 Mg Caps (Omega-3 Fatty Acids) .... Once Daily 3)  Fluticasone Propionate 50 Mcg/act Susp (Fluticasone Propionate) .Marland Kitchen.. 1-2 Sprays Each Nostril, Once or Twice Daily 4)  Cpap Advanced 12 Cwp 5)  Omeprazole 40 Mg Cpdr (Omeprazole) .Marland Kitchen.. 1 Tab By Mouth Two Times A Day  Allergies: No Known Drug Allergies  Physical Exam  General:  no acute distress interactive Mouth:  mm moist normal pharynx Abdomen:  Bowel sounds positive,abdomen soft and non-tender without masses, organomegaly or hernias noted. Extremities:  no edema   Impression & Recommendations:  Problem # 1:  VIRAL GASTROENTERITIS (ICD-008.8)  no signs of dehydration or bacterial infection.  Encurage liquids and pepto as needed.    Orders: [facility]- Est Level  3 (62376)  Complete Medication List: 1)  B-12 500 Mcg Tabs (Cyanocobalamin) .... Once daily 2)  Eql Fish Oil 1000  Mg Caps (Omega-3 fatty acids) .... Once daily 3)  Fluticasone Propionate 50 Mcg/act Susp (Fluticasone propionate) .Marland Kitchen.. 1-2 sprays each nostril, once or twice daily 4)  Cpap Advanced 12 Cwp  5)  Omeprazole 40 Mg Cpdr (Omeprazole) .Marland Kitchen.. 1 tab by mouth two times a day  Patient Instructions: 1)  Clear liquids small amounts very frequently 2)  Slowly restart solid foods start with soup 3)  Can take peptobismol for diarrhea 4)  Do not go to work until your symptoms are gone 5)  If lasts more than another 3 days or if you get fever or bleeding 6)  call us

## 2011-01-11 NOTE — Progress Notes (Signed)
Summary: phn msg   Phone Note Call from Patient Call back at [phone] or [phone]   Caller: Patient Summary of Call: Pt wishing to talk to Dr. Lelon Perla about changing her meds back to the HCTZ. She does not have the money to come in at this time. Initial call taken by: Clydell Hakim,  June 30, 2010 8:44 AM  Follow-up for Phone Call        For any medication changes she will need to come in for an office visit. Follow-up by: Angelena Sole MD,  June 30, 2010 10:34 AM  Additional Follow-up for Phone Call Additional follow up Details #1::        I told her she would need to come in. she said hum & hung up Additional Follow-up by: Golden Circle RN,  June 30, 2010 3:59 PM

## 2011-01-11 NOTE — Progress Notes (Signed)
Summary: Rx Prob   Phone Note Call from Patient Call back at [phone] or [phone]   Caller: Patient Summary of Call: Pt would like to switch bp meds back to what she was taking.  She has started having problems with legs swelling on new meds.  Was taking HCTZ.  Has tried the new meds for about 6 weeks.   Initial call taken by: Clydell Hakim,  June 29, 2010 9:12 AM  Follow-up for Phone Call        to pcp. appt needed? Follow-up by: Golden Circle RN,  June 29, 2010 9:27 AM  Additional Follow-up for Phone Call Additional follow up Details #1::        Yes, please have pt schedule an appointment Additional Follow-up by: Angelena Sole MD,  June 29, 2010 11:53 AM

## 2011-01-11 NOTE — Assessment & Plan Note (Signed)
Summary: f/up,tcb   Vital Signs:  Patient profile:   55 year old female Height:      61.25 inches Weight:      183.1 pounds BMI:     34.44 Temp:     97.6 degrees F oral Pulse rate:   93 / minute BP sitting:   113 / 75  (right arm) Cuff size:   regular  Vitals Entered By: Garen Grams LPN (May 24, 2010 10:09 AM) CC: f/u bp and ear inf Is Patient Diabetic? No Pain Assessment Patient in pain? no        Primary Care Provider:  Angelena Sole MD  CC:  f/u bp and ear inf.  History of Present Illness: 1. Blood pressure:   Pt is taking and tolerating her medicine as prescribed.  She hasn't been checking her blood pressure at home but feels that it is better controlled.      ROS: denies headache, vision changes, shortness of breath  2. Ear infection:  Dx'ed with otitis externa at last OV.  She was given oral Cipro and has been taking it every day as prescribed.  She thinks that her infection is greatly improved.  She doesn't have the pain in her ears like before.      ROS: denies fevers  3. Chest pain:  Was describing some anginal type CP with last OV.  She thinks that this was due to her ear infection.  It was a dull, achey pain and she thinks that it was just from not feeling well.  Ever since she started taking the antibiotic she hasn't had any pain.  She is able to walk and go up stairs without any chest pain.      ROS: denies LE swelling, orthopnea  Habits & Providers  Alcohol-Tobacco-Diet     Tobacco Status: never  Current Medications (verified): 1)  B-12 500 Mcg Tabs (Cyanocobalamin) .... Once Daily 2)  Eql Fish Oil 1000 Mg Caps (Omega-3 Fatty Acids) .... Once Daily 3)  Cpap Advanced 12 Cwp 4)  Diovan Hct 80-12.5 Mg Tabs (Valsartan-Hydrochlorothiazide) .... Take 1 Tab By Mouth Once Daily 5)  Ciprofloxacin Hcl 500 Mg Tabs (Ciprofloxacin Hcl) .Marland Kitchen.. 1 Tab By Mouth Two Times A Day For 10 Days 6)  Flonase 50 Mcg/act Susp (Fluticasone Propionate) .Marland Kitchen.. 1 Spray Each Nostril  Daily  Allergies: 1)  ! Lisinopril (Lisinopril)  Past History:  Past Medical History: Obstructive Sleep apnea- NPSG 01/09/03 [facility] 79/ hr Sjogrens' Syndrome- cervical node bx Dr Annalee Genta HTN Hx of recurrent AOMs, s/p tube in right ear  Social History: Reviewed history from 02/26/2007 and no changes required. Lives w/ 2 kids, husband, mom.  Works as Insurance underwriter.  Quit smoking 6 yrs ago.  Denies ETOH.  Exercises by walking and aerobics 2xweek.  Physical Exam  General:  Vital signs reviewed Gen: alert in no acute distress Ears: some external ear canal irritation but no signs of obvious infection.  Tube in place in R TM.  L TM with some scarring but no bulging or effusion. CV: regular rate and rhythm without murmurs Resp: clear to auscultation bilaterally, normal work of breathing Abd: soft, non-tender, non-distended Ext: no lower extremity edema Psych: not depressed appearing    Impression & Recommendations:  Problem # 1:  HYPERTENSION, BENIGN SYSTEMIC (ICD-401.1) Assessment Improved Continue same medications Her updated medication list for this problem includes:    Diovan Hct 80-12.5 Mg Tabs (Valsartan-hydrochlorothiazide) .Marland Kitchen... Take 1 tab by mouth once daily  Orders: Basic  [facility] [phone]) [facility]- Est  Level 4 (33295)  Problem # 2:  OTITIS EXTERNA (ICD-380.10) Assessment: Improved  Finish course of Cipro.  Orders: [facility]- Est  Level 4 (18841)  Problem # 3:  CHEST PAIN (ICD-786.50) Assessment: New  Not concerning for cardiac origin.  Atypical in nature.  Will continue to monitor.  Orders: [facility]- Est  Level 4 (99214)  Complete Medication List: 1)  B-12 500 Mcg Tabs (Cyanocobalamin) .... Once daily 2)  Eql Fish Oil 1000 Mg Caps (Omega-3 fatty acids) .... Once daily 3)  Cpap Advanced 12 Cwp  4)  Diovan Hct 80-12.5 Mg Tabs (Valsartan-hydrochlorothiazide) .... Take 1 tab by mouth once daily 5)  Ciprofloxacin Hcl 500 Mg Tabs (Ciprofloxacin hcl) .Marland Kitchen.. 1 tab by mouth  two times a day for 10 days 6)  Flonase 50 Mcg/act Susp (Fluticasone propionate) .Marland Kitchen.. 1 spray each nostril daily  Other Orders: Mammogram (Screening) (Mammo)  Patient Instructions: 1)  I think that you are doing great 2)  The Cipro seems like it got rid of the ear infection 3)  Your blood pressure is perfect, I think that we have you on a good medicine 4)  I am going to check your kidney function today and will let you know of the results 5)  Please schedule a follow up appointment in 3 months  Prevention & Chronic Care Immunizations   Influenza vaccine: Not documented    Tetanus booster: 02/15/2006: Done.   Tetanus booster due: 02/16/2016    Pneumococcal vaccine: Not documented  Colorectal Screening   Hemoccult: Not documented   Hemoccult due: Not Indicated    Colonoscopy: sessile polyp and medium hemorrhoids.  Repeat Colonscopy in 5-10 years  (10/05/2009)   Colonoscopy due: 5-10 years  Other Screening   Pap smear: Done.  (07/17/2005)   Pap smear due: Not Indicated    Mammogram: normal  (03/26/2007)   Mammogram action/deferral: Ordered  (05/24/2010)   Mammogram due: 03/2008   Smoking status: never  (05/24/2010)  Lipids   Total Cholesterol: 225  (02/26/2007)   LDL: 159  (02/26/2007)   LDL Direct: Not documented   HDL: 44  (02/26/2007)   Triglycerides: 110  (02/26/2007)    SGOT (AST): 26  (09/14/2009)   SGPT (ALT): 34  (09/14/2009)   Alkaline phosphatase: 112  (09/14/2009)   Total bilirubin: 1.7  (09/14/2009)    Lipid flowsheet reviewed?: Yes   Progress toward LDL goal: Unchanged  Hypertension   Last Blood Pressure: 113 / 75  (05/24/2010)   Serum creatinine: 0.75  (05/14/2010)   Serum potassium 3.8  (05/14/2010)    Hypertension flowsheet reviewed?: Yes   Progress toward BP goal: Unchanged  Self-Management Support :   Personal Goals (by the next clinic visit) :      Personal blood pressure goal: 140/90  (05/24/2010)     Personal LDL goal: 130   (05/24/2010)    Hypertension self-management support: Not documented    Lipid self-management support: Not documented    Nursing Instructions: Schedule screening mammogram (see order)

## 2011-04-29 NOTE — Op Note (Signed)
   NAME:  Emma Curtis, Emma Curtis                     ACCOUNT NO.:  [id number]   MEDICAL RECORD NO.:  [id number]                   PATIENT TYPE:  AMB   LOCATION:  DSC                                  FACILITY:  [facility]   PHYSICIAN:  Harvie Junior, M.D.                DATE OF BIRTH:  [birth year]   DATE OF PROCEDURE:  05/23/2003  DATE OF DISCHARGE:  05/23/2003                                 OPERATIVE REPORT   PREOPERATIVE DIAGNOSIS:  Carpal tunnel syndrome, left.   POSTOPERATIVE DIAGNOSIS:  Carpal tunnel syndrome, left.   OPERATION PERFORMED:  Left carpal tunnel release.   SURGEON:  Harvie Junior, M.D.   ASSISTANT:  Marshia Ly, P.A.   ANESTHESIA:  Forearm based IV regional.   INDICATIONS FOR PROCEDURE:  The patient is a 55 year old female with a long  history of having significant carpal tunnel syndrome who ultimately EMGs  which documented this.  Because of failure of all conservative care she was  taken to the operating room for carpal tunnel release.   DESCRIPTION OF PROCEDURE:  The patient was taken to the operating room and  after adequate anesthesia was obtained with a forearm based IV regional.  The patient was placed on the operating table.  The left arm was prepped and  draped in the usual sterile fashion.  Following this, a curved incision was  made just ulnar to the midline wrist crease, the subcutaneous tissue  dissected down to the level of the volar carpal ligament which was divided  initially. A small rent was made.  A Freer elevator was used to make sure  the nerve was not adherent and the ligament was divided both proximally and  distally.  At this point, a gloved finger could be placed in the wound  proximally and distally and the nerve was completely freed up.  The median  nerve was identified as well as the motor branch and there was no tendency  towards compression at this point. The wound was copiously irrigated and  suctioned dry. The skin was closed with a  combination of running and  interrupted 4-0 nylon suture.  A sterile compressive dressing was applied.  The patient was then transferred to the recovery room where she was noted to  be in satisfactory condition.  The estimated blood loss for this procedure  was none.                                               Harvie Junior, M.D.    Ranae Plumber  D:  08/07/2003  T:  08/08/2003  Job:  [id number]

## 2011-04-29 NOTE — Op Note (Signed)
Emma Curtis, Emma Curtis           ACCOUNT NO.:  [id number]   MEDICAL RECORD NO.:  [id number]          PATIENT TYPE:  AMB   LOCATION:  SDS                          FACILITY:  [facility]   PHYSICIAN:  Kinnie Scales. Annalee Genta, M.D.DATE OF BIRTH:  1956-08-02   DATE OF PROCEDURE:  08/02/2006  DATE OF DISCHARGE:                                 OPERATIVE REPORT   PREOPERATIVE DIAGNOSES:  1. Chronic serous otitis media.  2. Conductive hearing loss.  3. Chronic xerostomia.  4. Possible Sjogren's syndrome.   POSTOPERATIVE DIAGNOSES:  1. Chronic serous otitis media.  2. Conductive hearing loss.  3. Chronic xerostomia.  4. Possible Sjogren's syndrome.   INDICATIONS FOR PROCEDURE:  1. Chronic serous otitis media.  2. Conductive hearing loss.  3. Chronic xerostomia.  4. Possible Sjogren's syndrome.   SURGICAL PROCEDURE:  1. Bilateral myringotomy and tube placement.  2. Biopsy right parotid gland.   SURGEON:  Kinnie Scales. Annalee Genta, M.D.   ANESTHESIA:  General.   COMPLICATIONS:  None.   ESTIMATED BLOOD LOSS:  Minimal.   The patient was transferred from the operating room to the recovery room in  stable condition.   BRIEF HISTORY:  Emma Curtis is a 55 year old black female who was referred  for evaluation of chronic dry mouth and possible Sjogren's syndrome.  She  had been seen and treated by her medical physician with laboratory  evaluation for Sjogren's syndrome but diagnosis was inconclusive.  We  recommended biopsy of the salivary gland.  The patient had nodularity in the  parotid glands and we opted to biopsy a major part of the gland in order to  have a better chance for pathologic diagnosis of Sjogren's syndrome.  The  patient has also had a chronic history of serous otitis media and conductive  hearing loss.  No significant history of recurrent infection or prior  otologic problems.  Given this history and examination I recommended we  undertake bilateral myringotomy and tube  placement and biopsy of the right  parotid gland under general anesthesia.  Risks, benefits and possible  complications of the procedure were discussed in detail with the patient who  understood and concurred with our plan for surgery which was scheduled for  August 02, 2006.   PROCEDURE:  The patient was brought to the operating room at [facility] main OR and placed in supine position on the operating table.  General endotracheal anesthesia was established without difficulty.  When  the patient was adequately anesthetized, her ears were examined using  binocular microscopy.  Being on the right-hand side an anterior inferior  myringotomy was performed and serous middle ear effusion was aspirated from  the middle ear space.  An Armstrong-grommet tympanostomy tube was placed  without difficulty and Ciprodex drops were instilled within the ear canal.  On the patient's left-hand side a similar procedure was carried out with  removal of cerumen.  Anterior inferior myringotomy was performed.  A serous  middle ear effusion was aspirated and an Armstrong-grommet tympanostomy tube  was inserted without difficulty.  Ciprodex drops were instilled within the  ear canal and this concluded  the otologic portion of the procedure.   Attention was then turned to the patient's parotid gland. She was injected  with a total of 2 cc of 1% lidocaine, 1:100,000 of epinephrine injected into  the pre-auricular crease.  After allowing adequate time for hemostasis, the  patient was prepped and draped in a sterile fashion and positioned on the  operating table.  A 1 cm curvilinear incision was created in the anterior  pre-auricular crease along the tragus.  This was carried through the skin  and subcutaneous tissue and dissection was carried down to the level of the  superficial parotid fascia. The parotid gland was palpated and the area of  nodularity was selected for biopsy and approximately 0.5 cm  portion of  parotid gland tissue was then resected using Bovie electrocautery and was  sent to pathology for gross microscopic evaluation to rule out Sjogren's  syndrome.  There was minimal bleeding which was cauterized.  The patient's  wound was irrigated and the incision was closed in layers with re-  approximation of the deep subcutaneous tissue with 4-0 Vicryl suture.  Final  skin edge was approximated with interrupted 5-0 Ethilon and the patient's  incision was dressed with Bacitracin ointment. The patient was then awakened  from her anesthetic, extubated and transferred from the operating room to  the recovery room in stable condition.  No complications.  Blood loss  minimal.           ______________________________  Kinnie Scales. Annalee Genta, M.D.     DLS/MEDQ  D:  04/54/0981  T:  08/02/2006  Job:  [id number]

## 2011-04-29 NOTE — Op Note (Signed)
   NAME:  Emma Curtis, Emma Curtis                     ACCOUNT NO.:  [id number]   MEDICAL RECORD NO.:  [id number]                   PATIENT TYPE:  AMB   LOCATION:  DSC                                  FACILITY:  [facility]   PHYSICIAN:  Harvie Junior, M.D.                DATE OF BIRTH:  04/29/1956   DATE OF PROCEDURE:  04/11/2003  DATE OF DISCHARGE:                                 OPERATIVE REPORT   IDENTIFYING STATEMENT:  She is a 55 year old female undergoing orthopedic  surgery.   PREOPERATIVE DIAGNOSIS:  Carpal tunnel syndrome, right.   POSTOPERATIVE DIAGNOSIS:  Carpal tunnel syndrome, right.   OPERATION/PROCEDURE:  Right carpal tunnel release.   ASSISTANT:  Marshia Ly, P.A.   ANESTHESIA:  General, IV regional.   BRIEF HISTORY:  This is a 55 year old female with a long history of having  significant symptoms of carpal tunnel syndrome on her right hand.  We  ultimately tried conservative care and this failed and because of continued  complaints with conservative care, we ultimately took her to the operating  room for release of her right carpal tunnel.   DESCRIPTION OF PROCEDURE:  The patient was taken to the operating room and  after adequate anesthesia was obtained with a forearm-based IV regional, the  patient was placed in the supine position on the operating room table.  The  right arm was prepped and draped in the usual sterile fashion.  Following  this, a curved incision was made, just ulnar to the midline.  The incision  was made in the crease, taken through the subcutaneous tissue, taken down  over the volar carpal ligaments, clearly identified and divided in a  proximal and distal direction.  Once the ligament had been divided, the  median nerve was identified and noted to have an hourglass constriction, to  be fairly edematous in its mid portion.  There was a look made down into the  canal at this point to make sure there were no space-occupying lesions.  At  this  point, the wound was copiously irrigated and suctioned dry.  A gloved  finger could be placed in the wound proximally and distally.  The wound was  then closed with a combination of interrupted and running Vicryl suture.  A  sterile compressive dressing was applied as well as a volar plaster and the  patient was taken to the recovery room and was noted to be in satisfactory  condition.  Estimated blood loss of the procedure was none.                                                Harvie Junior, M.D.    Ranae Plumber  D:  04/11/2003  T:  04/11/2003  Job:  [id number]

## 2011-04-29 NOTE — Op Note (Signed)
NAME:  Emma Curtis, Emma Curtis                     ACCOUNT NO.:  [id number]   MEDICAL RECORD NO.:  [id number]                   PATIENT TYPE:  AMB   LOCATION:  DSC                                  FACILITY:  [facility]   PHYSICIAN:  Harvie Junior, M.D.                DATE OF BIRTH:  Jul 01, 1956   DATE OF PROCEDURE:  11/28/2003  DATE OF DISCHARGE:                                 OPERATIVE REPORT   PREOPERATIVE DIAGNOSIS:  Mass, right long finger.   POSTOPERATIVE DIAGNOSES:  1. Mass, right long finger.  2. 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  3. Tendon defect, right long finger.   PROCEDURES:  1. Excision of mass right long finger.  2. Exploration and debridement of right long PIP joint.  3. Repair of extensor tendon, right long finger.   SURGEON:  Harvie Junior, M.D.   ASSISTANT:  Marshia Ly, P.A.   ANESTHESIA:  General.   INDICATIONS FOR PROCEDURE:  This is a 55 year old female with a long history  of having had right carpal tunnel surgery. She ultimately had been using the  hands postoperatively and trying to get back into doing things when she  noticed the cyst over her right long finger. The cyst became more and more  painful, and because of the continued complaints of pain, we ultimately  discussed excision of  the mass and she was brought to the operating room  for this procedure.   DESCRIPTION OF PROCEDURE:  The patient was brought to the operating room and  after adequate anesthesia was obtained with a forearm based IV regional, the  patient was placed in the supine position on the operating table. The right  hand  was then prepped and draped in the usual sterile fashion.   Following this a curved incision was made over the mass on the right long  finger. Once this was had been done, the mass was easily encountered and the  borders of the mass were identified. At this point it became clear  that the  mass had eroded and erupted through the  extensor tendon of the long finger  and so the mass was identified and debrided.   Following  this the mass was tracked distally into the proximal  interphalangeal joint. The proximal  interphalangeal joint  was explored and  irrigated. All of this proliferative synovial process was debrided, leaving  about a 2-cm rent in the extensor tendon to the finger. The synovial process  was also debrided proximally  medially and laterally from the PIP joint.   At this point the finger and the PIP joint were copiously irrigated and  suctioned dry. The extensor tendon rent was repaired with a 4-0 Vicryl  interrupted suture. At this point the skin was closed with 4-0 nylon  interrupted sutures. A sterile compressive dressing was applied and the  patient was taken to the recovery room where she was  noted to be in  satisfactory condition. Estimated blood loss for this procedure was none.                                               Harvie Junior, M.D.    Ranae Plumber  D:  11/28/2003  T:  11/28/2003  Job:  [id number]

## 2011-08-03 ENCOUNTER — Other Ambulatory Visit: Payer: Self-pay | Admitting: Family Medicine

## 2011-08-03 NOTE — Telephone Encounter (Signed)
Refill request

## 2011-09-16 LAB — CBC
HCT: 41.4 % (ref 36.0–46.0)
Platelets: 246 10*3/uL (ref 150–400)
WBC: 7 10*3/uL (ref 4.0–10.5)

## 2011-09-16 LAB — TYPE AND SCREEN
ABO/RH(D): A POS
Antibody Screen: NEGATIVE

## 2011-09-16 LAB — OCCULT BLOOD X 1 CARD TO LAB, STOOL: Fecal Occult Bld: POSITIVE

## 2011-09-16 LAB — ABO/RH: ABO/RH(D): A POS

## 2011-09-16 LAB — DIFFERENTIAL
Eosinophils Relative: 4 % (ref 0–5)
Lymphocytes Relative: 24 % (ref 12–46)
Lymphs Abs: 1.6 10*3/uL (ref 0.7–4.0)
Neutrophils Relative %: 65 % (ref 43–77)

## 2011-09-16 LAB — BASIC METABOLIC PANEL
BUN: 12 mg/dL (ref 6–23)
GFR calc non Af Amer: >60 mL/min (ref >60)
Potassium: 3.4 mEq/L — ABNORMAL LOW (ref 3.5–5.1)

## 2012-04-27 ENCOUNTER — Ambulatory Visit (INDEPENDENT_AMBULATORY_CARE_PROVIDER_SITE_OTHER): Payer: BC Managed Care – PPO | Admitting: Family Medicine

## 2012-04-27 ENCOUNTER — Ambulatory Visit: Payer: BC Managed Care – PPO

## 2012-04-27 VITALS — BP 160/108 | HR 84 | Temp 98.3°F | Wt 177.5 lb

## 2012-04-27 DIAGNOSIS — I10 Essential (primary) hypertension: Secondary | ICD-10-CM

## 2012-04-27 MED ORDER — AMLODIPINE BESYLATE 5 MG PO TABS: 5.0000 mg | ORAL_TABLET | Freq: Every day | ORAL | Status: DC

## 2012-04-27 MED ORDER — HYDROCHLOROTHIAZIDE 25 MG PO TABS: 25.0000 mg | ORAL_TABLET | Freq: Every day | ORAL | Status: DC

## 2012-04-27 NOTE — Assessment & Plan Note (Addendum)
BP Readings from Last 3 Encounters:  04/27/12 160/108  09/08/10 154/80  08/23/10 133/83  when you graph out her pressures since 2008 she has not been in the goal range most of the time. She says she only takes HCTZ, she hasnt taken her medication recently because she was out. Refilled HCTZ and restarted amlodipine. F/u in 2 weeks.  Last labs were in 2011 - would get BMP/urine protein next visit.

## 2012-04-27 NOTE — Patient Instructions (Signed)
Meds ordered this encounter  Medications  . hydrochlorothiazide (HYDRODIURIL) 25 MG tablet    Sig: Take 1 tablet (25 mg total) by mouth daily.    Dispense:  30 tablet    Refill:  6  . amLODipine (NORVASC) 5 MG tablet    Sig: Take 1 tablet (5 mg total) by mouth daily.    Dispense:  30 tablet    Refill:  6  Please follow up in 2 weeks for another blood pressure follow up.  Thank you for keeping a pressure diary, keep doing it this next two weeks.

## 2012-04-27 NOTE — Progress Notes (Signed)
  Subjective:   Patient ID: Emma Curtis, female DOB: [birth year] 56 y.o. MRN: [id number] HPI:  1. HTN f/u Disease Monitoring Home BP Monitoring range 140/90 to 180/100 Chest pain- no     Dyspnea-  yes  Medications Compliance: medication compliance problems: ran out of meds and didn't refill them, only taking one prescribed medication. Lightheadedness-  no  Edema-  no   History  Substance Use Topics  . Smoking status: Former Smoker    Types: Cigarettes  . Smokeless tobacco: Never Used  . Alcohol Use: No   Review of Systems: Pertinent items are noted in HPI.  Labs Reviewed: last labs in 2011 - recommend PCP getting new BMP/urine protein Reviewed Chart Review for last notes.     Objective:   Filed Vitals:   04/27/12 1105  BP: 160/108  Pulse: 84  Temp: 98.3 F (36.8 C)  TempSrc: Oral  Weight: 177 lb 8 oz (80.513 kg)   Physical Exam: General: aaf, nad, pleasant Lungs:  Normal respiratory effort, chest expands symmetrically. Lungs are clear to auscultation, no crackles or wheezes. Heart - Regular rate and rhythm.  No murmurs, gallops or rubs.    Extremities:   Non-tender, No cyanosis, edema, or deformity noted. Neck:  No deformities, thyromegaly, masses, or tenderness noted.   Supple with full range of motion without pain.  Assessment & Plan:

## 2012-05-11 ENCOUNTER — Encounter: Payer: Self-pay | Admitting: Family Medicine

## 2012-05-11 ENCOUNTER — Ambulatory Visit (INDEPENDENT_AMBULATORY_CARE_PROVIDER_SITE_OTHER): Payer: BC Managed Care – PPO | Admitting: Family Medicine

## 2012-05-11 VITALS — BP 148/92 | HR 96 | Temp 97.8°F | Ht 61.0 in | Wt 174.1 lb

## 2012-05-11 DIAGNOSIS — I10 Essential (primary) hypertension: Secondary | ICD-10-CM

## 2012-05-11 NOTE — Progress Notes (Signed)
  Subjective:   Patient ID: Emma Curtis, female DOB: 03/30/1956 56 y.o. MRN: [id number] HPI:  1. HTN f/u Disease Monitoring Home BP Monitoring - yes, kept a very concise bp list.   BP Readings from Last 3 Encounters:  05/11/12 148/92  04/27/12 160/108  09/08/10 154/80   Chest pain- no     Dyspnea-  No Has lost 4 lbs since new meds.   Medications Compliance: medication compliance good now.  Lightheadedness-  no  Edema-  no   History  Substance Use Topics  . Smoking status: Former Smoker    Types: Cigarettes  . Smokeless tobacco: Never Used  . Alcohol Use: No   Review of Systems: Pertinent items are noted in HPI.  Labs Reviewed: last labs in 2011 - recommend PCP getting new BMP/urine protein Reviewed Chart Review for last notes.     Objective:   Filed Vitals:   05/11/12 1517  BP: 148/92  Pulse: 96  Temp: 97.8 F (36.6 C)  TempSrc: Oral  Height: 5\' 1"  (1.549 m)  Weight: 174 lb 1.6 oz (78.971 kg)   Physical Exam: General: aaf, nad, pleasant Lungs:  Normal respiratory effort, chest expands symmetrically. Lungs are clear to auscultation, no crackles or wheezes. Heart - Regular rate and rhythm.  No murmurs, gallops or rubs.    Extremities:   Non-tender, No cyanosis, edema, or deformity noted. Neck:  No deformities, thyromegaly, masses, or tenderness noted.   Supple with full range of motion without pain.  Assessment & Plan:

## 2012-05-11 NOTE — Assessment & Plan Note (Signed)
BP Readings from Last 3 Encounters:  05/11/12 148/92  04/27/12 160/108  09/08/10 154/80  better controlled today. Will maintain current regimen with 2 week follow up to see how it trends. Advised to continue low salt diet.

## 2012-05-11 NOTE — Patient Instructions (Signed)
It was great to see you today!  Schedule an appointment to see me in 2 weeks.  We will keep your medications the same today.  I want your pressure to be 140/80 or less.

## 2012-06-11 ENCOUNTER — Telehealth: Payer: Self-pay | Admitting: Family Medicine

## 2012-06-11 ENCOUNTER — Encounter: Payer: Self-pay | Admitting: Family Medicine

## 2012-06-11 ENCOUNTER — Ambulatory Visit (HOSPITAL_COMMUNITY)
Admission: RE | Admit: 2012-06-11 | Discharge: 2012-06-11 | Disposition: A | Discharge status: Home / Self Care | Payer: BC Managed Care – PPO | Source: Ambulatory Visit | Attending: Family Medicine | Admitting: Family Medicine

## 2012-06-11 ENCOUNTER — Ambulatory Visit (INDEPENDENT_AMBULATORY_CARE_PROVIDER_SITE_OTHER): Payer: BC Managed Care – PPO | Admitting: Family Medicine

## 2012-06-11 VITALS — BP 125/84 | HR 89 | Temp 98.2°F | Ht 61.0 in | Wt 172.0 lb

## 2012-06-11 DIAGNOSIS — R079 Chest pain, unspecified: Secondary | ICD-10-CM | POA: Insufficient documentation

## 2012-06-11 DIAGNOSIS — R0789 Other chest pain: Secondary | ICD-10-CM

## 2012-06-11 NOTE — Patient Instructions (Signed)
It was nice to meet you.  Your ECG today is normal and does not indicate that you are having a heart attack.  I am sending you to the cardiologist for further evaluation.  Your blood pressure today was BP: 125/84 mmHg.  Remember your goal blood pressure is about 130/80.  Please be sure to take your medication every day.  Great job!  If you have worsening chest pain, or chest pain that does not go away, or chest pain when you are exerting yourself, go to the hospital.

## 2012-06-11 NOTE — Progress Notes (Signed)
  Subjective:    Patient ID: Emma Curtis, female    DOB: December 06, 1956, 56 y.o.   MRN: [id number]  HPI  Emma Curtis presents to clinic complaining of intermittent chest pain x 1 month. She says there is nothing in particular that brings it on or makes it go away.  The episodes last for 15-20 minutes.  She describes it as pressure in her chest. She says she has had some associated dyspnea and dizziness, but also admits to anxiety associated with the chest pain.  The chest pain is not worse with exertion.  She has no cardiac history, has never had chest pain before.   Past Medical History  Diagnosis Date  . Obstructive sleep apnea 01/09/03    ahi 79/hr  . Sjogren's syndrome     cervical node  bx dr Annalee Genta  . HTN (hypertension)   . AOM (acute otitis media)     hx  of s/p tube right ear   Family History  Problem Relation Age of Onset  . Heart defect Brother     died age 70 heart/valve dz  . Melanoma Sister     died age 31 melanoma/sarcoma  . Cancer Maternal Aunt     dm and cancer  . Dementia Father     died age 23  . Cancer Maternal Grandfather     colon ca died  . Dementia Paternal Grandmother     died dementia  - Negative for coronary disease  History  Substance Use Topics  . Smoking status: Former Smoker    Types: Cigarettes  . Smokeless tobacco: Never Used  . Alcohol Use: No  - 20+ pack year smoking history  Review of Systems Pertinent items in HPI.     Objective:   Physical Exam BP 125/84  Pulse 89  Temp 98.2 F (36.8 C) (Oral)  Ht 5\' 1"  (1.549 m)  Wt 172 lb (78.019 kg)  BMI 32.50 kg/m2 General appearance: alert, cooperative and no distress Neck: no JVD and supple, symmetrical, trachea midline Lungs: clear to auscultation bilaterally Heart: regular rate and rhythm, S1, S2 normal, no murmur, click, rub or gallop Extremities: extremities normal, atraumatic, no cyanosis or edema       Assessment & Plan:

## 2012-06-11 NOTE — Telephone Encounter (Signed)
Patient is calling wanting to speak to Dr. Lula Olszewski about the Cardiologist appt.  She wanted to speak to her MD about a cough and cold symptoms she has had.

## 2012-06-12 MED ORDER — FLUTICASONE PROPIONATE 50 MCG/ACT NA SUSP: 1.0000 | Freq: Every day | NASAL | Status: DC

## 2012-06-12 NOTE — Telephone Encounter (Signed)
Called patient back- she says that she has had a cough she forgot to tell me about.  It has been going on about the same time as the chest pain.  She says her amlodpine was increased about when the chest pain and cough started.  She has allergies and has not been taking her Claritin regularly, nor the Flonase.    I told her to try taking claritin and flonase again- she says she usually takes Claritin D because the other does not work.  I told her the D part can raise your blood pressure so she should try just the regular Claritin.  Told her to call the office if she is not feeling better in a week.

## 2012-06-22 ENCOUNTER — Institutional Professional Consult (permissible substitution): Payer: BC Managed Care – PPO | Admitting: Cardiovascular Disease

## 2012-09-19 ENCOUNTER — Telehealth: Payer: Self-pay | Admitting: Family Medicine

## 2012-09-19 NOTE — Telephone Encounter (Signed)
Pt is not sure if she needs to come in - some mornings her eyes are matted and red - wants to discuss with nurse

## 2012-09-19 NOTE — Telephone Encounter (Signed)
Returned call to patient.  C/o waking up in morning with eyes having "white crust."  Patient denies any itching or discolored eye drainage.  Patient has been using OTC allergy eye drops and also takes Claritin/Claritin-D.  Offered patient an appt for evaluation.  Patient declines and will wait and see if sxs resolve.  Will call back for appt as needed.  Gaylene Brooks, RN

## 2012-11-28 ENCOUNTER — Other Ambulatory Visit: Payer: Self-pay | Admitting: Family Medicine

## 2012-12-06 ENCOUNTER — Other Ambulatory Visit: Payer: Self-pay | Admitting: Family Medicine

## 2012-12-10 ENCOUNTER — Encounter: Payer: Self-pay | Admitting: Family Medicine

## 2012-12-10 ENCOUNTER — Ambulatory Visit (INDEPENDENT_AMBULATORY_CARE_PROVIDER_SITE_OTHER): Payer: BC Managed Care – PPO | Admitting: Family Medicine

## 2012-12-10 VITALS — BP 126/88 | HR 111 | Temp 100.8°F | Ht 61.25 in | Wt 164.0 lb

## 2012-12-10 DIAGNOSIS — J111 Influenza due to unidentified influenza virus with other respiratory manifestations: Secondary | ICD-10-CM

## 2012-12-10 DIAGNOSIS — R6889 Other general symptoms and signs: Secondary | ICD-10-CM

## 2012-12-10 MED ORDER — OSELTAMIVIR PHOSPHATE 75 MG PO CAPS: 75.0000 mg | ORAL_CAPSULE | Freq: Two times a day (BID) | ORAL | Status: DC

## 2012-12-10 MED ORDER — AMLODIPINE BESYLATE 5 MG PO TABS: 5.0000 mg | ORAL_TABLET | Freq: Every day | ORAL | Status: DC

## 2012-12-10 NOTE — Patient Instructions (Addendum)
Take the [facility] twice a day for the next 5 days.  Keep taking the Mucinex if it's helping.    I have refilled your blood pressure medicine.  It was good to see you today, I hope you feel better and have a Happy New Year!

## 2012-12-10 NOTE — Progress Notes (Signed)
  Subjective:    Patient ID: Emma Curtis, female    DOB: 1956-11-22, 56 y.o.   MRN: [id number]  HPI  1.  Flu-like symptoms:  Present x 2 days.  Patient started with sore throat, dyspnea, coughing, and myalgias.  Now with progressive myalgias, cough, and coryza.  Temp at home that ranges 100 to 101. Has tried Mucinex without relief.  Mild nausea, unable to eat but drinking well.  Also with chills.   Review of Systems See HPI above for review of systems.       Objective:   Physical Exam  BP 126/88  Pulse 111  Temp 100.8 F (38.2 C) (Oral)  Ht 5' 1.25" (1.556 m)  Wt 164 lb (74.39 kg)  BMI 30.74 kg/m2  SpO2 97% Gen:  Patient sitting on exam table, appears stated age in no acute distress.  Does appear to be ill though nontoxic.   Head: Normocephalic atraumatic Eyes: EOMI, PERRL, sclera and conjunctiva non-erythematous Nose:  Nasal turbinates grossly enlarged bilaterally. Some exudates noted.  Mouth: Mucosa membranes moist. Tonsils +2, nonenlarged, non-erythematous. Neck: Some submandibular adenopathy noted BL.   Heart:  RRR, no murmurs auscultated. Pulm:  Clear to auscultation bilaterally with good air movement.  No wheezes or rales noted.          Assessment & Plan:

## 2012-12-10 NOTE — Assessment & Plan Note (Signed)
Plan to treat due to severity of symptoms and fact she has had symptoms less than 48 hours.   Did NOT have flu shot this year.   Tamiflu x 5 days -- she also has Sjogren's and I would like to prevent any complications.   FU in 7 - 10 days if not improving or sooner if worsening.

## 2012-12-20 ENCOUNTER — Telehealth: Payer: Self-pay | Admitting: Family Medicine

## 2012-12-20 MED ORDER — HYDROCODONE-HOMATROPINE 5-1.5 MG/5ML PO SYRP: 5.0000 mL | ORAL_SOLUTION | Freq: Three times a day (TID) | ORAL | Status: DC | PRN

## 2012-12-20 NOTE — Telephone Encounter (Signed)
Patient notified

## 2012-12-20 NOTE — Telephone Encounter (Signed)
I can send in a cough syrup for her after lunch.

## 2012-12-20 NOTE — Telephone Encounter (Signed)
Patient states she is much improved but still has a hacking cough. Does not keep her awake at night. Just has lingered on with this cough.  Will send message to Dr. Gwendolyn Grant

## 2012-12-20 NOTE — Telephone Encounter (Signed)
Was seen last week and she is still coughing a lot and is asking for cough meds -   [facility] [address]

## 2012-12-20 NOTE — Telephone Encounter (Signed)
Done and placed in "to be faxed" box.  

## 2013-01-09 ENCOUNTER — Telehealth: Payer: Self-pay | Admitting: Family Medicine

## 2013-01-09 NOTE — Telephone Encounter (Signed)
Patient would like to speak to the nurse about some continued symptoms from the flu that she is still having.

## 2013-01-09 NOTE — Telephone Encounter (Signed)
Patient states she has drainage from eyes and lots of mucous in throat.  Does not feel bad just has this continued drainage. Appointment scheduled 01/11/2013

## 2013-01-11 ENCOUNTER — Ambulatory Visit (INDEPENDENT_AMBULATORY_CARE_PROVIDER_SITE_OTHER): Payer: BC Managed Care – PPO | Admitting: Family Medicine

## 2013-01-11 ENCOUNTER — Encounter: Payer: Self-pay | Admitting: Family Medicine

## 2013-01-11 VITALS — BP 137/88 | HR 96 | Temp 98.5°F | Ht 61.25 in | Wt 168.0 lb

## 2013-01-11 DIAGNOSIS — R221 Localized swelling, mass and lump, neck: Secondary | ICD-10-CM

## 2013-01-11 DIAGNOSIS — Z87898 Personal history of other specified conditions: Secondary | ICD-10-CM

## 2013-01-11 MED ORDER — AMLODIPINE BESYLATE 5 MG PO TABS: 5.0000 mg | ORAL_TABLET | Freq: Every day | ORAL | Status: DC

## 2013-01-11 MED ORDER — HYDROCHLOROTHIAZIDE 25 MG PO TABS: 25.0000 mg | ORAL_TABLET | Freq: Every day | ORAL | Status: DC

## 2013-01-11 MED ORDER — PILOCARPINE HCL 5 MG PO TABS
5.0000 mg | ORAL_TABLET | Freq: Three times a day (TID) | ORAL | Status: DC
Start: 2013-01-11 — End: 2013-11-26

## 2013-01-11 NOTE — Patient Instructions (Signed)
Your glands seem to be swollen after the infection you had. We are going to try to promote production of saliva and hope this calms things down. I want you to see [place] next week so we can see how things are doing and see if we need to extend the medicine beyond 10 days.   Health Maintenance Due  Topic Date Due  . Pap Smear  01/16/1974  . Mammogram  03/27/2009  . Influenza Vaccine  08/12/2012   Thanks, Dr. Durene Cal

## 2013-01-13 NOTE — Progress Notes (Signed)
Subjective:   1. Neck swelling-patient with likely influenza in late decmber treated with Tamiful x 5 days due to her history of Sjogren's disease. About 10 days later had a lingering cough though other symptoms improved and had cough medicine called in. She felt better for approximately a week but still had sinus congestion, drainage and eye congestion. 1 week ago she noticed that her neck felt "puffy". It began to become more firm over 2-3 days but has since started to improve in size, swelling, firmness. Denies nausea/vomiting/fever/chills/fatigue/unintentional weight loss. Patient is still having tearing from eyes as well as rhinorrhea and feelings of sinus pressure (no pain in teeth or sinus tenderness). Also has sore throat. No shortness of breath. Throat does feel full. No tongue swelling. Mouth has felt drier over last few weeks.    ROS--See HPI  Past Medical History Patient Active Problem List  Diagnosis  . HYPERLIPIDEMIA NEC/NOS  . HYPERTENSION, BENIGN SYSTEMIC  . ALLERGIC RHINITIS  . MENOPAUSAL SYNDROME  . OSTEOARTHRITIS  . VERTIGO NOS OR DIZZINESS  . APNEA, SLEEP  . SJOGREN'S SYNDROME, HX OF   Reviewed problem list.  Medications- reviewed and updated Chief complaint-noted  Objective: BP 137/88  Pulse 96  Temp 98.5 F (36.9 C) (Oral)  Ht 5' 1.25" (1.556 m)  Wt 168 lb (76.204 kg)  BMI 31.48 kg/m2 Gen: NAD, resting comfortably HEENT: tearing form eyes, TM normal, rhinorrhea noted, no tongue swelling Neck: firm, nodular type appearance throughout submandibular region. No swelling noted over parotids.  CV: RRR no murmurs rubs or gallops Lungs: CTAB no crackles, wheeze, rhonchi Skin: warm, dry Neuro: grossly normal, moves all extremities  Assessment/Plan:  Throat swelling-see a/p Sjogren's.

## 2013-01-13 NOTE — Assessment & Plan Note (Signed)
Suspect post-viral glandular enlargement. Improving slightly. Ordered pilocarpine to try to induce secretion production in hopes this may reduce glandular swelling. Advised patient to follow up with [place] early next week. ALso, encouraged patient to follow up with her ENT physician (she plans on visiting them today and seeing if she can get an appointment).   Interesting that patient has allergic rhinitis and tearing while also being dry, reduced secretion in the mouth. Instructed patient to seek immediate care for shortness of breath, tongue swelling, or worsening of throat swelling.

## 2013-01-21 ENCOUNTER — Ambulatory Visit: Payer: BC Managed Care – PPO | Admitting: Family Medicine

## 2013-07-17 ENCOUNTER — Telehealth: Payer: Self-pay | Admitting: Family Medicine

## 2013-07-17 NOTE — Telephone Encounter (Signed)
Will fwd to MD.  Emma Curtis, CMA  

## 2013-07-17 NOTE — Telephone Encounter (Signed)
Pt is requesting refills be sent to [facility]. SHe did ask the pharmacy to request a refill but they told her to call here. She would like amlodpine and hyrdochlorothiazide > JW

## 2013-07-18 NOTE — Telephone Encounter (Signed)
I just called and talked to [facility] pharmacist who said pt has refills until January 2015. Please let her know.  Leona Singleton, MD

## 2013-07-18 NOTE — Telephone Encounter (Signed)
Pt informed. Fleeger, [name]  

## 2013-09-12 ENCOUNTER — Telehealth: Payer: Self-pay | Admitting: Family Medicine

## 2013-09-12 NOTE — Telephone Encounter (Signed)
Pt wants to change her bp medication. She wants to change because it is making her feet swell. Blood pressure is not being controlled. 160/96, 148/82 are some of the readings Please advise

## 2013-09-12 NOTE — Telephone Encounter (Signed)
Will forward to MD. Jazmin Hartsell,CMA  

## 2013-09-13 NOTE — Telephone Encounter (Signed)
Patient should schedule appointment for follow-up at [facility]. Norvasc can cause leg swelling so she can stop this, however she should continue her other medicines including hydrochlorothazide which is helping control BP. She needs [facility] appointment within 1 week to decide what other antihypertensive she can take.  Leona Singleton, MD

## 2013-09-16 NOTE — Telephone Encounter (Signed)
Left message for pt to call back.  Please give message when she does. Refugio Mcconico,CMA  

## 2013-11-11 DIAGNOSIS — L0390 Cellulitis, unspecified: Secondary | ICD-10-CM

## 2013-11-11 HISTORY — DX: Cellulitis, unspecified: L03.90

## 2013-11-25 ENCOUNTER — Other Ambulatory Visit: Payer: Self-pay | Admitting: Family Medicine

## 2013-11-25 ENCOUNTER — Ambulatory Visit
Admission: RE | Admit: 2013-11-25 | Discharge: 2013-11-25 | Disposition: A | Discharge status: Home / Self Care | Payer: BC Managed Care – PPO | Source: Ambulatory Visit | Attending: Family Medicine | Admitting: Family Medicine

## 2013-11-25 ENCOUNTER — Ambulatory Visit (INDEPENDENT_AMBULATORY_CARE_PROVIDER_SITE_OTHER): Payer: BC Managed Care – PPO | Admitting: Family Medicine

## 2013-11-25 ENCOUNTER — Encounter: Payer: Self-pay | Admitting: Family Medicine

## 2013-11-25 ENCOUNTER — Telehealth: Payer: Self-pay | Admitting: Emergency Medicine

## 2013-11-25 VITALS — BP 124/80 | HR 109 | Temp 99.0°F | Ht 61.0 in | Wt 175.0 lb

## 2013-11-25 DIAGNOSIS — L0390 Cellulitis, unspecified: Secondary | ICD-10-CM | POA: Insufficient documentation

## 2013-11-25 DIAGNOSIS — M7989 Other specified soft tissue disorders: Secondary | ICD-10-CM

## 2013-11-25 DIAGNOSIS — L0291 Cutaneous abscess, unspecified: Secondary | ICD-10-CM

## 2013-11-25 MED ORDER — DOXYCYCLINE HYCLATE 100 MG PO TABS: 100.0000 mg | ORAL_TABLET | Freq: Two times a day (BID) | ORAL | Status: DC

## 2013-11-25 NOTE — Progress Notes (Signed)
Emma Curtis is a 57 y.o. female who presents to [facility] today for [place] appt for leg pain  Leg pain: swollen and painful. Slept all day Saturday and Sunday w/ flu like symptoms. Fever to 102. Swelling started yesterday. Warm. Achy. Denies CP, palpitations, SOB. Has had DVT before   The following portions of the patient's history were reviewed and updated as appropriate: allergies, current medications, past medical history, family and social history, and problem list.  Patient is a nonsmoker.   Past Medical History  Diagnosis Date  . Obstructive sleep apnea 01/09/03    ahi 79/hr  . Sjogren's syndrome     cervical node  bx dr Annalee Genta  . HTN (hypertension)   . AOM (acute otitis media)     hx  of s/p tube right ear  . DVT (deep venous thrombosis) 1989    immediately postpartum     ROS as above otherwise neg.    Medications reviewed. Current Outpatient Prescriptions  Medication Sig Dispense Refill  . amLODipine (NORVASC) 5 MG tablet Take 1 tablet (5 mg total) by mouth daily.  90 tablet  3  . cyanocobalamin 500 MCG tablet Take 500 mcg by mouth daily.        Marland Kitchen doxycycline (VIBRA-TABS) 100 MG tablet Take 1 tablet (100 mg total) by mouth 2 (two) times daily.  20 tablet  0  . fish oil-omega-3 fatty acids 1000 MG capsule Take by mouth daily.        . fluticasone (FLONASE) 50 MCG/ACT nasal spray Place 1 spray into the nose daily. Each nostril daily  16 g  2  . hydrochlorothiazide (HYDRODIURIL) 25 MG tablet Take 1 tablet (25 mg total) by mouth daily.  30 tablet  11  . loratadine (CLARITIN) 10 MG tablet Take 10 mg by mouth daily.      . NON FORMULARY cpap advanced 12 cwp       . pilocarpine (SALAGEN) 5 MG tablet Take 1 tablet (5 mg total) by mouth 3 (three) times daily.  30 tablet  0   No current facility-administered medications for this visit.    Exam: BP 124/80  Pulse 109  Temp(Src) 99 F (37.2 C) (Oral)  Ht 5\' 1"  (1.549 m)  Wt 175 lb (79.379 kg)  BMI 33.08 kg/m2 Gen: Well  NAD HEENT: EOMI,  MMM Lungs: CTABL Nl WOB Heart: RRR no MRG Abd: NABS, NT, ND Exts: L leg circ at calf 43cm adn R leg circ at calf 39cm.  Skin L leg erythematous and warm   No results found for this or any previous visit (from the past 72 hour(s)).  A/P (as seen in Problem list)  Cellulitis Rapid onset cellulitis w/ possible phlegmasia Likely compounded by DVT Due to rapid onset there is concern for MRSA adn will treat w/ Doxy. Will also check for DM given obesity and hyperglycemia in the past as will direct therapy for possible pseudo coverage.    Leg swelling Likely DVT. H/o DVT in late 80s immediately postpartum Will need anticoag if DVT LE duplex now at [facility] imaging

## 2013-11-25 NOTE — Patient Instructions (Signed)
Please go to get your dopplers done today Please start the antibiotics today Please come back to see me on Wednesday at 9:30  Cellulitis Cellulitis is an infection of the skin and the tissue beneath it. The infected area is usually red and tender. Cellulitis occurs most often in the arms and lower legs.  CAUSES  Cellulitis is caused by bacteria that enter the skin through cracks or cuts in the skin. The most common types of bacteria that cause cellulitis are Staphylococcus and Streptococcus. SYMPTOMS   Redness and warmth.  Swelling.  Tenderness or pain.  Fever. DIAGNOSIS  Your caregiver can usually determine what is wrong based on a physical exam. Blood tests may also be done. TREATMENT  Treatment usually involves taking an antibiotic medicine. HOME CARE INSTRUCTIONS   Take your antibiotics as directed. Finish them even if you start to feel better.  Keep the infected arm or leg elevated to reduce swelling.  Apply a warm cloth to the affected area up to 4 times per day to relieve pain.  Only take over-the-counter or prescription medicines for pain, discomfort, or fever as directed by your caregiver.  Keep all follow-up appointments as directed by your caregiver. SEEK MEDICAL CARE IF:   You notice red streaks coming from the infected area.  Your red area gets larger or turns dark in color.  Your bone or joint underneath the infected area becomes painful after the skin has healed.  Your infection returns in the same area or another area.  You notice a swollen bump in the infected area.  You develop new symptoms. SEEK IMMEDIATE MEDICAL CARE IF:   You have a fever.  You feel very sleepy.  You develop vomiting or diarrhea.  You have a general ill feeling (malaise) with muscle aches and pains. MAKE SURE YOU:   Understand these instructions.  Will watch your condition.  Will get help right away if you are not doing well or get worse. Document Released: 09/07/2005  Document Revised: 05/29/2012 Document Reviewed: 02/13/2012 [facility] Patient Information 2014 [place], [place].

## 2013-11-25 NOTE — Assessment & Plan Note (Addendum)
Likely DVT. H/o DVT in late 80s immediately postpartum Will need anticoag if DVT LE duplex now at [facility] imaging  -------------------------------------------------------------------- Addendum [place] w/o evidence of DVT Likely edematous changes from cellulitis Pt called and given results over phone

## 2013-11-25 NOTE — Assessment & Plan Note (Addendum)
Rapid onset cellulitis w/ possible phlegmasia Likely compounded by DVT Due to rapid onset there is concern for MRSA adn will treat w/ Doxy. Will also check for DM given obesity and hyperglycemia in the past as will direct therapy for possible pseudo coverage.

## 2013-11-25 NOTE — Telephone Encounter (Signed)
Marylene Land from [facility] called... reports a negative for DVT (LLE) Report given to Dr. Konrad Dolores.

## 2013-11-26 ENCOUNTER — Ambulatory Visit (INDEPENDENT_AMBULATORY_CARE_PROVIDER_SITE_OTHER): Payer: BC Managed Care – PPO | Admitting: Family Medicine

## 2013-11-26 ENCOUNTER — Encounter: Payer: Self-pay | Admitting: Family Medicine

## 2013-11-26 ENCOUNTER — Encounter (HOSPITAL_COMMUNITY): Payer: Self-pay | Admitting: General Practice

## 2013-11-26 ENCOUNTER — Telehealth: Payer: Self-pay | Admitting: Family Medicine

## 2013-11-26 ENCOUNTER — Inpatient Hospital Stay (HOSPITAL_COMMUNITY)
Admission: AD | Admit: 2013-11-26 | Discharge: 2013-11-30 | DRG: 603 | Disposition: A | Discharge status: Home / Self Care | Payer: BC Managed Care – PPO | Source: Ambulatory Visit | Attending: Family Medicine | Admitting: Family Medicine

## 2013-11-26 VITALS — BP 135/94 | HR 103 | Temp 98.5°F | Wt 175.0 lb

## 2013-11-26 DIAGNOSIS — R7309 Other abnormal glucose: Secondary | ICD-10-CM

## 2013-11-26 DIAGNOSIS — E785 Hyperlipidemia, unspecified: Secondary | ICD-10-CM | POA: Diagnosis present

## 2013-11-26 DIAGNOSIS — M7989 Other specified soft tissue disorders: Secondary | ICD-10-CM

## 2013-11-26 DIAGNOSIS — L0390 Cellulitis, unspecified: Secondary | ICD-10-CM

## 2013-11-26 DIAGNOSIS — I10 Essential (primary) hypertension: Secondary | ICD-10-CM

## 2013-11-26 DIAGNOSIS — L02419 Cutaneous abscess of limb, unspecified: Principal | ICD-10-CM | POA: Diagnosis present

## 2013-11-26 DIAGNOSIS — R51 Headache: Secondary | ICD-10-CM | POA: Diagnosis present

## 2013-11-26 DIAGNOSIS — Z86718 Personal history of other venous thrombosis and embolism: Secondary | ICD-10-CM

## 2013-11-26 DIAGNOSIS — G4733 Obstructive sleep apnea (adult) (pediatric): Secondary | ICD-10-CM | POA: Diagnosis present

## 2013-11-26 DIAGNOSIS — R739 Hyperglycemia, unspecified: Secondary | ICD-10-CM

## 2013-11-26 DIAGNOSIS — L0291 Cutaneous abscess, unspecified: Secondary | ICD-10-CM

## 2013-11-26 DIAGNOSIS — M3500 Sicca syndrome, unspecified: Secondary | ICD-10-CM | POA: Diagnosis present

## 2013-11-26 DIAGNOSIS — Z87891 Personal history of nicotine dependence: Secondary | ICD-10-CM

## 2013-11-26 DIAGNOSIS — Z808 Family history of malignant neoplasm of other organs or systems: Secondary | ICD-10-CM

## 2013-11-26 DIAGNOSIS — Z800 Family history of malignant neoplasm of digestive organs: Secondary | ICD-10-CM

## 2013-11-26 DIAGNOSIS — M79609 Pain in unspecified limb: Secondary | ICD-10-CM | POA: Diagnosis present

## 2013-11-26 HISTORY — DX: Cellulitis, unspecified: L03.90

## 2013-11-26 HISTORY — DX: Other complications of anesthesia, initial encounter: T88.59XA

## 2013-11-26 HISTORY — DX: Unspecified osteoarthritis, unspecified site: M19.90

## 2013-11-26 HISTORY — DX: Obstructive sleep apnea (adult) (pediatric): G47.33

## 2013-11-26 HISTORY — DX: Adverse effect of unspecified anesthetic, initial encounter: T41.45XA

## 2013-11-26 HISTORY — DX: Gastro-esophageal reflux disease without esophagitis: K21.9

## 2013-11-26 HISTORY — DX: Dependence on other enabling machines and devices: Z99.89

## 2013-11-26 LAB — COMPREHENSIVE METABOLIC PANEL
Albumin: 3.1 g/dL — ABNORMAL LOW (ref 3.5–5.2)
Alkaline Phosphatase: 113 U/L (ref 39–117)
BUN: 11 mg/dL (ref 6–23)
CO2: 25 mEq/L (ref 19–32)
Creatinine, Ser: 0.74 mg/dL (ref 0.50–1.10)
GFR calc Af Amer: >90 mL/min (ref >90)
GFR calc non Af Amer: >90 mL/min (ref >90)
Glucose, Bld: 98 mg/dL (ref 70–99)
Potassium: 3.6 mEq/L (ref 3.5–5.1)
Total Bilirubin: 1.1 mg/dL (ref 0.3–1.2)
Total Protein: 7.7 g/dL (ref 6.0–8.3)

## 2013-11-26 LAB — CBC WITH DIFFERENTIAL/PLATELET
Basophils Relative: 0 % (ref 0–1)
Eosinophils Absolute: 0.4 10*3/uL (ref 0.0–0.7)
Eosinophils Relative: 3 % (ref 0–5)
Hemoglobin: 13.1 g/dL (ref 12.0–15.0)
Lymphs Abs: 1.6 10*3/uL (ref 0.7–4.0)
MCH: 29.6 pg (ref 26.0–34.0)
Monocytes Absolute: 0.8 10*3/uL (ref 0.1–1.0)
Monocytes Relative: 7 % (ref 3–12)
Neutro Abs: 8.8 10*3/uL — ABNORMAL HIGH (ref 1.7–7.7)
Neutrophils Relative %: 75 % (ref 43–77)
RBC: 4.42 MIL/uL (ref 3.87–5.11)

## 2013-11-26 LAB — POCT GLYCOSYLATED HEMOGLOBIN (HGB A1C): Hemoglobin A1C: 5.7

## 2013-11-26 MED ORDER — ACETAMINOPHEN 650 MG RE SUPP: 650.0000 mg | Freq: Four times a day (QID) | RECTAL | Status: DC | PRN

## 2013-11-26 MED ORDER — ASPIRIN EC 81 MG PO TBEC
81.0000 mg | DELAYED_RELEASE_TABLET | Freq: Every day | ORAL | Status: DC
  Administered 2013-11-26 – 2013-11-30 (×5): 81 mg via ORAL
  Filled 2013-11-26 (×6): qty 1

## 2013-11-26 MED ORDER — PILOCARPINE HCL 5 MG PO TABS
5.0000 mg | ORAL_TABLET | Freq: Three times a day (TID) | ORAL | Status: DC
  Administered 2013-11-27 – 2013-11-30 (×10): 5 mg via ORAL
  Filled 2013-11-26 (×15): qty 1

## 2013-11-26 MED ORDER — HEPARIN SODIUM (PORCINE) 5000 UNIT/ML IJ SOLN
5000.0000 [IU] | Freq: Three times a day (TID) | INTRAMUSCULAR | Status: DC
  Administered 2013-11-26 – 2013-11-30 (×12): 5000 [IU] via SUBCUTANEOUS
  Filled 2013-11-26 (×14): qty 1

## 2013-11-26 MED ORDER — HYDROCODONE-ACETAMINOPHEN 5-325 MG PO TABS
1.0000 | ORAL_TABLET | ORAL | Status: DC | PRN
  Administered 2013-11-26 – 2013-11-30 (×5): 2 via ORAL
  Filled 2013-11-26 (×5): qty 2

## 2013-11-26 MED ORDER — AMLODIPINE BESYLATE 5 MG PO TABS
5.0000 mg | ORAL_TABLET | Freq: Every day | ORAL | Status: DC
  Administered 2013-11-26 – 2013-11-30 (×5): 5 mg via ORAL
  Filled 2013-11-26 (×6): qty 1

## 2013-11-26 MED ORDER — ACETAMINOPHEN 325 MG PO TABS: 650.0000 mg | ORAL_TABLET | Freq: Four times a day (QID) | ORAL | Status: DC | PRN

## 2013-11-26 MED ORDER — SODIUM CHLORIDE 0.45 % IV SOLN
INTRAVENOUS | Status: DC
  Administered 2013-11-26: 19:00:00 via INTRAVENOUS

## 2013-11-26 MED ORDER — SENNA 8.6 MG PO TABS
1.0000 | ORAL_TABLET | Freq: Two times a day (BID) | ORAL | Status: DC
  Administered 2013-11-26 – 2013-11-30 (×7): 8.6 mg via ORAL
  Filled 2013-11-26 (×11): qty 1

## 2013-11-26 MED ORDER — HYDROCHLOROTHIAZIDE 25 MG PO TABS
25.0000 mg | ORAL_TABLET | Freq: Every day | ORAL | Status: DC
  Administered 2013-11-26 – 2013-11-28 (×3): 25 mg via ORAL
  Filled 2013-11-26 (×3): qty 1

## 2013-11-26 MED ORDER — VANCOMYCIN HCL 10 G IV SOLR
1250.0000 mg | INTRAVENOUS | Status: AC
  Administered 2013-11-26: 1250 mg via INTRAVENOUS
  Filled 2013-11-26: qty 1250

## 2013-11-26 MED ORDER — VANCOMYCIN HCL IN DEXTROSE 1-5 GM/200ML-% IV SOLN
1000.0000 mg | Freq: Two times a day (BID) | INTRAVENOUS | Status: DC
  Administered 2013-11-27 – 2013-11-29 (×5): 1000 mg via INTRAVENOUS
  Filled 2013-11-26 (×6): qty 200

## 2013-11-26 NOTE — Telephone Encounter (Signed)
Pt now has blisters on leg and foot is swollen. She has an appt with Konrad Dolores tomorrow Please advise

## 2013-11-26 NOTE — Telephone Encounter (Signed)
Attempted to call pt back.  LMOVM for callback.  Per Dr. Konrad Dolores pt needs to be seen today.  He is ok with being double booked @ 4pm.  If she is unable to come into office today, would strongly advise/urge her to go to the ED. Darielys Giglia, Maryjo [place]

## 2013-11-26 NOTE — H&P (Signed)
Seen and examined in the [facility].  Discussed with Dr. Konrad Dolores.  Agree with his management and documentation.  Briefly, 57 yo female with impressive left leg cellulitis.  She was seen in [facility] yesterday and started on oral doxy with first dose being given at noon.  The leg redness and swelling is worse today, extending both up the leg and down to the foot.  She had venous dopplers yesterday which were negative for DVT.  She is not diabetic and not know to be immunocompromised in any way.   On the positive side, she has no complaints of systemic symptoms, denies fever, chills and malaise.   While it may be a bit early to say she has failed outpatient treatment and it is certainly too early to say she has failed doxy, I am concerned by the extent of her cellulitis.  Agree with hospitalization before it progresses further and before develops sepsis.  Vanc should be sufficient since this should be the typical gram positive causes of cellulitis.

## 2013-11-26 NOTE — H&P (Signed)
Family Medicine Teaching [facility] Admission History and Physical Service Pager: [phone]  Patient name: Emma Curtis Medical record number: [id number] Date of birth: 27-Jun-1956 Age: 57 y.o. Gender: female  Primary Care Provider: Simone Curia, MD Consultants: None Code Status: Full  Chief Complaint: Cellulitis - Direct admit from clinic  Assessment and Plan: Emma Curtis is a 57 y.o. female presenting with worsening cellulitis and leg swelling despite antibiotics. PMH is significant for HTN, HLD, OSA, DVT (postpartum), and Sjogren's.  Cellulitis: Acute onset and w/o response to Doxycycline in outpt setting. Involvement of ankle and foot in clinic after 24 hrs of abx. No signs of systemic symptoms. No systemic symptoms concerning for sepsis. LE [place] on 11/25/13 w/o evidence of DVT.  - Admit to inpt for obs (will change to inpt if needed) - Dr Leveda Anna admitting physician [facility] x2 (likely to be negative given >24hrs PO ABX) - IV vanc (No DM so unlikely to need pseudo coverage) - IVF due to decreased PO - CBC w/ diff - Tylenol or vicodin for pain - consider imaging if L ankle does not improve.   HTN: Well controlled on home regimen - continue home norvasc, and HCTZ - VS per floor routine  HLD: Not on any lipid controlling medications.  - Repeat fasting lipid in the am. To assess need for statin therapy - CMET  Leg swelling: acute onset. DVT has been r/o. Likely secondary to cellulitis  Sjogren's: well controlled per pt.  - Continue home pilocarpine  FEN/GI: Decreased PO.  - 1/2 NS 165ml/hr - regular diet  Prophylaxis: Hep Bellamy TID  Disposition: pending clinical improvement  History of Present Illness: Emma Curtis is a 57 y.o. female presenting with L leg swelling and redness for 2 days. Acute viral illness over the weekend that left pt in bed for 2 straight days. No fevers, chills, malaise. Leg is painful to palpation  Review Of Systems: Per  HPI with the following additions: none Otherwise 12 point review of systems was performed and was unremarkable.  Patient Active Problem List   Diagnosis Date Noted  . Hyperglycemia 11/26/2013  . Leg swelling 11/25/2013  . Cellulitis 11/25/2013  . Flu-like symptoms 12/10/2012  . Atypical chest pain 06/11/2012  . SJOGREN'S SYNDROME, HX OF 05/14/2010  . ALLERGIC RHINITIS 04/09/2009  . OSTEOARTHRITIS 01/13/2009  . HYPERLIPIDEMIA NEC/NOS 02/26/2007  . HYPERTENSION, BENIGN SYSTEMIC 02/08/2007  . MENOPAUSAL SYNDROME 02/08/2007  . VERTIGO NOS OR DIZZINESS 02/08/2007  . APNEA, SLEEP 02/08/2007   Past Medical History: Past Medical History  Diagnosis Date  . Obstructive sleep apnea 01/09/03    ahi 79/hr  . Sjogren's syndrome     cervical node  bx dr Annalee Genta  . HTN (hypertension)   . AOM (acute otitis media)     hx  of s/p tube right ear  . DVT (deep venous thrombosis) 1989    immediately postpartum    Past Surgical History: Past Surgical History  Procedure Laterality Date  . Cesarean section      x 2  . Carpal tunnel release  4.1.04    bilateral repair  . Laparotomy    . Laparoscopic hysterectomy  12/12/94    bladder rupture complication  . Cervical biopsy      cervical node biopsy   Social History: History  Substance Use Topics  . Smoking status: Former Smoker    Types: Cigarettes  . Smokeless tobacco: Never Used  . Alcohol Use: No   Additional social history:  Please also refer to relevant sections of EMR.  Family History: Family History  Problem Relation Age of Onset  . Heart defect Brother     died age 2 heart/valve dz  . Melanoma Sister     died age 24 melanoma/sarcoma  . Cancer Maternal Aunt     dm and cancer  . Dementia Father     died age 20  . Cancer Maternal Grandfather     colon ca died  . Dementia Paternal Grandmother     died dementia   Allergies and Medications: Allergies  Allergen Reactions  . Lisinopril     REACTION: COUGH only   No  current facility-administered medications on file prior to encounter.   Current Outpatient Prescriptions on File Prior to Encounter  Medication Sig Dispense Refill  . amLODipine (NORVASC) 5 MG tablet Take 1 tablet (5 mg total) by mouth daily.  90 tablet  3  . cyanocobalamin 500 MCG tablet Take 500 mcg by mouth daily.        Marland Kitchen doxycycline (VIBRA-TABS) 100 MG tablet Take 1 tablet (100 mg total) by mouth 2 (two) times daily.  20 tablet  0  . fish oil-omega-3 fatty acids 1000 MG capsule Take by mouth daily.        . fluticasone (FLONASE) 50 MCG/ACT nasal spray Place 1 spray into the nose daily. Each nostril daily  16 g  2  . hydrochlorothiazide (HYDRODIURIL) 25 MG tablet Take 1 tablet (25 mg total) by mouth daily.  30 tablet  11  . loratadine (CLARITIN) 10 MG tablet Take 10 mg by mouth daily.      . NON FORMULARY cpap advanced 12 cwp       . pilocarpine (SALAGEN) 5 MG tablet Take 1 tablet (5 mg total) by mouth 3 (three) times daily.  30 tablet  0    Objective: There were no vitals taken for this visit. Exam: Gen: Well NAD  HEENT: EOMI, MMM  Lungs: CTABL Nl WOB  Heart: RRR no MRG  Abd: NABS, NT, ND  Exts: L leg circ at calf 43cm adn R leg circ at calf 39cm.  Skin L leg erythematous and warm w/ small bulea formations diffusely. Extension of erythema beyond previous markings.   Labs and Imaging: CBC BMET  No results found for this basename: WBC, HGB, HCT, PLT,  in the last 168 hours No results found for this basename: NA, K, CL, CO2, BUN, CREATININE, GLUCOSE, CALCIUM,  in the last 168 hours     Ozella Rocks, MD 11/26/2013, 4:45 PM PGY-3, [facility] Family Medicine FPTS Intern pager: [phone], text pages welcome

## 2013-11-26 NOTE — Progress Notes (Signed)
Family Practice Teaching Service Interval Progress Note  S: patient is a clinic admission for cellulitis not responsive to doxy after one day of treatment. Patient states she is doing well. She is about to get her first dose of vanc.  O: BP 145/85  Pulse 97  Temp(Src) 98.1 F (36.7 C) (Oral)  Resp 18  SpO2 98% Gen: NAD, laying comfortably in bed Skin: left leg with erythema and warmth with small bullae, erythema to just below the knee and extending to the plantar midfoot, markings noted  A/P: patient with apparent cellulitis. Please see H&P by Dr Konrad Dolores for full plan. -will admit for IV vanc treatment for potential MRSA infection -will treat pain with vicodin -consider imaging if not improving on IV vanc   Marikay Alar, MD [facility] Medicine PGY-2 Service Pager [phone]

## 2013-11-26 NOTE — Assessment & Plan Note (Signed)
Worsening. Admitting to inpt  Precepted by Dr. Leveda Anna

## 2013-11-26 NOTE — Progress Notes (Addendum)
ANTIBIOTIC CONSULT NOTE - INITIAL  Pharmacy Consult for Vancomycin Indication: cellulitis  Allergies  Allergen Reactions  . Lisinopril     REACTION: COUGH only   Patient Measurements:  Weight 79.4 kg on 11/26/13 Height 155 cm on 11/26/13 Vital Signs: Temp: 98.5 F (36.9 C) (12/16 1616) Temp src: Oral (12/16 1616) BP: 135/94 mmHg (12/16 1616) Pulse Rate: 103 (12/16 1616) Labs: No results found for this basename: WBC, HGB, PLT, LABCREA, CREATININE,  in the last 72 hours The CrCl is unknown because both a height and weight (above a minimum accepted value) are required for this calculation. Microbiology: No results found for this or any previous visit (from the past 720 hour(s)). 11/26/13- Blood cultures x2 pending  Medical History: Past Medical History  Diagnosis Date  . Obstructive sleep apnea 01/09/03    ahi 79/hr  . Sjogren's syndrome     cervical node  bx dr Annalee Genta  . HTN (hypertension)   . AOM (acute otitis media)     hx  of s/p tube right ear  . DVT (deep venous thrombosis) 1989    immediately postpartum    Medications:   Assessment: 48 YOF admitted 11/26/13 from the [facility] Medicine clinic with worsening cellulitis and leg swelling despite 24 hours of outpatient doxycycline now to start IV vancomycin therapy per pharmacy dosing. Currently, cellulitis involves the ankle and foot. No current imaging but plan to image if L ankle does not improve per progress notes. Contacted Family Medicine and confirmed no concern for osteomyelitis at this time.   Currently all labs are pending.   Goal of Therapy:  Vancomycin trough level 10-15 mcg/ml  Plan:  1. Vancomycin 1250mg  IV x1 now. 2. Follow-up renal function to determine further dosing.   Link Snuffer, PharmD, BCPS Clinical Pharmacist [phone] 11/26/2013,5:12 PM  Addendum:  SCr is 0.74, estimated CrCl~ 121mL/min WBC 11.7, currently afebrile.  Plan: Schedule Vancomycin 1000mg  IV q12h.  Continue to monitor  renal function and adjust therapy as needed. Vancomycin trough at steady state if indicated.   Link Snuffer, PharmD, BCPS Clinical Pharmacist [phone] 11/26/2013, 7:35 PM

## 2013-11-26 NOTE — Addendum Note (Signed)
Addended by: Konrad Dolores, Shyleigh Daughtry J on: 11/26/2013 04:44 PM   Modules accepted: Orders

## 2013-11-26 NOTE — Progress Notes (Signed)
Pt admitted to hospital  Please see admission H&P

## 2013-11-27 ENCOUNTER — Ambulatory Visit: Payer: BC Managed Care – PPO | Admitting: Family Medicine

## 2013-11-27 DIAGNOSIS — E785 Hyperlipidemia, unspecified: Secondary | ICD-10-CM

## 2013-11-27 LAB — LIPID PANEL
HDL: 21 mg/dL — ABNORMAL LOW (ref >39)
LDL Cholesterol: 127 mg/dL — ABNORMAL HIGH (ref 0–99)
Total CHOL/HDL Ratio: 8.4 RATIO
Triglycerides: 138 mg/dL (ref <150)
VLDL: 28 mg/dL (ref 0–40)

## 2013-11-27 LAB — BASIC METABOLIC PANEL
Chloride: 102 mEq/L (ref 96–112)
GFR calc Af Amer: >90 mL/min (ref >90)
GFR calc non Af Amer: >90 mL/min (ref >90)
Glucose, Bld: 99 mg/dL (ref 70–99)
Potassium: 3.1 mEq/L — ABNORMAL LOW (ref 3.5–5.1)
Sodium: 139 mEq/L (ref 135–145)

## 2013-11-27 LAB — CBC
HCT: 37.3 % (ref 36.0–46.0)
MCHC: 32.4 g/dL (ref 30.0–36.0)
Platelets: 240 10*3/uL (ref 150–400)
RBC: 4.22 MIL/uL (ref 3.87–5.11)
RDW: 13.6 % (ref 11.5–15.5)

## 2013-11-27 MED ORDER — POTASSIUM CHLORIDE CRYS ER 20 MEQ PO TBCR
40.0000 meq | EXTENDED_RELEASE_TABLET | Freq: Once | ORAL | Status: AC
  Administered 2013-11-27: 40 meq via ORAL
  Filled 2013-11-27: qty 2

## 2013-11-27 MED ORDER — ATORVASTATIN CALCIUM 40 MG PO TABS
40.0000 mg | ORAL_TABLET | Freq: Every day | ORAL | Status: DC
  Administered 2013-11-27 – 2013-11-29 (×3): 40 mg via ORAL
  Filled 2013-11-27 (×4): qty 1

## 2013-11-27 MED ORDER — ONDANSETRON HCL 4 MG PO TABS
4.0000 mg | ORAL_TABLET | Freq: Three times a day (TID) | ORAL | Status: DC | PRN
Start: 2013-11-27 — End: 2013-11-30
  Administered 2013-11-27: 4 mg via ORAL
  Filled 2013-11-27: qty 1

## 2013-11-27 NOTE — Progress Notes (Signed)
FMTS Attending Note Patient seen and examined by me, discussed with resident team and I agree with Dr Nyra Capes assessment and plan for today.  Patient is making mild improvement on IV vancomycin; continue current course, consider transition to orals once marked improvement noted.  Paula Compton, MD

## 2013-11-27 NOTE — Progress Notes (Signed)
Placed patient on auto titrate mode max 20 min 6 and small nasal mask.  Patient is tolerating well.

## 2013-11-27 NOTE — Progress Notes (Signed)
Family Medicine Teaching Service Daily Progress Note Intern Pager: [phone]  Patient name: Emma Curtis Medical record number: [id number] Date of birth: 10/18/1956 Age: 57 y.o. Gender: female  Primary Care Provider: Simone Curia, MD Consultants: None Code Status: Full  Pt Overview and Major Events to Date:   Assessment and Plan: BLANCHIE ZELEZNIK is a 57 y.o. female presenting with worsening cellulitis and leg swelling despite antibiotics. PMH is significant for HTN, HLD, OSA, DVT (postpartum), and Sjogren's.   #Cellulitis/leg swelling: Acute onset and w/o much  response to Doxycycline in outpt setting (only had 2 days of tx). Pt does not recall and puncture wounds or sites of infection other than possible spot on bottom of foot? Involvement of ankle and foot in clinic after 24 hrs of abx. No signs of systemic symptoms. No systemic symptoms concerning for sepsis. LE [place] on 11/25/13 w/o evidence of DVT. Has not gone past line of demarcation  - BCX x2 (likely to be negative given >24hrs PO ABX)  - IV vanc (No DM so unlikely to need pseudo coverage)  - IVF due to decreased PO, will d/c as beginning to take more PO - CBC w/ diff  - Tylenol or vicodin for pain  - consider imaging if L ankle does not improve.  #HTN: Well controlled on home regimen  - continue home norvasc, and HCTZ  - VS per floor routine   #HLD: Not on any lipid controlling medications. LDL 127 this morning, tcholesterol 176 -would start ator 40, 10 yr risk 7.9% based on panel this morning - CMET   #Sjogren's: well controlled per pt.  - Continue home pilocarpine  FEN/GI: HHD, 1/2NS @100ml /hr PPx: HSQ  Disposition: further improvement on IV vanc, possible imaging  Subjective: Some headache this morning but otherwise feels improved with vicodin for leg pain, attesting to some itching  Objective: Temp:  [98.1 F (36.7 C)-99.2 F (37.3 C)] 99.2 F (37.3 C) (12/17 0554) Pulse Rate:  [92-105] 92 (12/17  0554) Resp:  [18-19] 19 (12/17 0554) BP: (116-145)/(64-94) 116/67 mmHg (12/17 0554) SpO2:  [98 %] 98 % (12/17 0554) Weight:  [175 lb (79.379 kg)] 175 lb (79.379 kg) (12/16 1616) Physical Exam: Gen: Well NAD  HEENT: EOMI, MMM  Lungs: CTABL Nl WOB  Heart: RRR no MRG  Abd: NABS, NT, ND  Exts: L leg circ at calf 43cm adn R leg circ at calf 39cm.  Skin L leg erythematous and warm w/ small bulea formations diffusely. Extension of erythema at markings from 12/17, no worse  Laboratory:  Recent Labs Lab 11/26/13 1820 11/27/13 0500  WBC 11.7* 10.2  HGB 13.1 12.1  HCT 39.0 37.3  PLT 230 240    Recent Labs Lab 11/26/13 1820 11/27/13 0500  NA 139 139  K 3.6 3.1*  CL 101 102  CO2 25 27  BUN 11 10  CREATININE 0.74 0.76  CALCIUM 9.2 8.8  PROT 7.7  --   BILITOT 1.1  --   ALKPHOS 113  --   ALT 17  --   AST 21  --   GLUCOSE 98 99    Imaging/Diagnostic Tests:   Anselm Lis, MD 11/27/2013, 7:09 AM PGY-1, [facility] Family Medicine FPTS Intern pager: [phone], text pages welcome

## 2013-11-27 NOTE — Progress Notes (Signed)
Primary Care Provider Note:  I have seen and examined this patient in the hospital and greatly appreciate the care provided by the [facility] Psychiatric Medicine Teaching Service. I have encouraged her to follow up with me upon discharge from the hospital.  Leona Singleton, MD 11/27/2013 3:48 PM

## 2013-11-27 NOTE — Progress Notes (Signed)
UR completed.  Patient changed to inpatient r/t failing outpatient treatment and requiring IV antibiotics.

## 2013-11-28 LAB — CBC
MCHC: 33 g/dL (ref 30.0–36.0)
MCV: 86.4 fL (ref 78.0–100.0)
Platelets: 283 10*3/uL (ref 150–400)
RBC: 4.35 MIL/uL (ref 3.87–5.11)
RDW: 13.1 % (ref 11.5–15.5)
WBC: 9.4 10*3/uL (ref 4.0–10.5)

## 2013-11-28 LAB — BASIC METABOLIC PANEL
Chloride: 98 mEq/L (ref 96–112)
GFR calc Af Amer: >90 mL/min (ref >90)
GFR calc non Af Amer: >90 mL/min (ref >90)
Potassium: 3 mEq/L — ABNORMAL LOW (ref 3.5–5.1)

## 2013-11-28 MED ORDER — POTASSIUM CHLORIDE CRYS ER 20 MEQ PO TBCR
40.0000 meq | EXTENDED_RELEASE_TABLET | Freq: Two times a day (BID) | ORAL | Status: AC
  Administered 2013-11-28 (×2): 40 meq via ORAL
  Filled 2013-11-28 (×2): qty 2

## 2013-11-28 MED ORDER — HYDROCHLOROTHIAZIDE 25 MG PO TABS
25.0000 mg | ORAL_TABLET | Freq: Every day | ORAL | Status: DC
  Administered 2013-11-30: 25 mg via ORAL
  Filled 2013-11-28: qty 1

## 2013-11-28 NOTE — Progress Notes (Signed)
INITIAL NUTRITION ASSESSMENT  DOCUMENTATION CODES Per approved criteria  -Not Applicable   INTERVENTION: -Encouraged consumption of protein foods -Will supplement Ensure once daily as warranted  NUTRITION DIAGNOSIS: Inadequate energy intake related to fever/lethargy as evidenced by PO intake <75% est nutr needs for one week.   Goal: Pt to meet >/= 90% of their estimated nutrition needs   Monitor:  -Total pro/energy intake -Skin integrity -Labs -Weights  Reason for Assessment: Malnutrition Screening Tool - 39  57 y.o. female  Admitting Dx: <principal problem not specified>  ASSESSMENT: Emma Curtis is a 57 y.o. female presenting with worsening cellulitis and leg swelling despite antibiotics. PMH is significant for HTN, HLD, OSA, DVT (postpartum), and Sjogren's.  -Patient reported decreased appetite for past one week. Denied any n/v, just noted little interest in foods which pt attributed to lethargy and fevers Denied any unintentional wt loss. -Appetite is currently improving during admit, is able to tolerate fruits/vegetables and some starches. Minimal intake of protein foods; encouraged consumption of eggs, chicken, [place] to assist in meeting est pro needs -Declined supplementation at this time. Will continue to monitor PO intake and add Ensure shakes once daily as warranted -Pt was on IVFs when first admitted d/t decreased PO pta, IVF now d/c with improved intake per MD note -Pt w/LLE cellulitis  Height: Ht Readings from Last 1 Encounters:  11/25/13 5\' 1"  (1.549 m)    Weight: Wt Readings from Last 1 Encounters:  11/26/13 175 lb (79.379 kg)    Ideal Body Weight: 105 lbs/47.7kg  % Ideal Body Weight: 166%  Wt Readings from Last 10 Encounters:  11/26/13 175 lb (79.379 kg)  11/25/13 175 lb (79.379 kg)  01/11/13 168 lb (76.204 kg)  12/10/12 164 lb (74.39 kg)  06/11/12 172 lb (78.019 kg)  05/11/12 174 lb 1.6 oz (78.971 kg)  04/27/12 177 lb 8 oz (80.513 kg)   09/08/10 183 lb (83.008 kg)  08/23/10 184 lb (83.462 kg)  05/24/10 183 lb 1.6 oz (83.054 kg)    Usual Body Weight: 175 lbs/79.4kg  % Usual Body Weight: 100%  BMI:  There is no weight on file to calculate BMI.  Estimated Nutritional Needs: Kcal: 1600-2000 kcal (20-25 kcal/79.4kg) Protein: 65-80 g pro (0.8-1.0g/79.4kg) Fluid: 1800 ml  Skin: Cellulitis of left leg and foot  Diet Order: General  EDUCATION NEEDS: -No education needs identified at this time   Intake/Output Summary (Last 24 hours) at 11/28/13 1422 Last data filed at 11/28/13 0700  Gross per 24 hour  Intake 4223.33 ml  Output      0 ml  Net 4223.33 ml    Last BM:11/26/2013   Labs:   Recent Labs Lab 11/26/13 1820 11/27/13 0500 11/28/13 0930  NA 139 139 137  K 3.6 3.1* 3.0*  CL 101 102 98  CO2 25 27 30   BUN 11 10 7   CREATININE 0.74 0.76 0.78  CALCIUM 9.2 8.8 9.1  GLUCOSE 98 99 136*    CBG (last 3)  No results found for this basename: GLUCAP,  in the last 72 hours  Scheduled Meds: . amLODipine  5 mg Oral Daily  . aspirin EC  81 mg Oral Daily  . atorvastatin  40 mg Oral q1800  . heparin  5,000 Units Subcutaneous Q8H  . [START ON 11/30/2013] hydrochlorothiazide  25 mg Oral Daily  . pilocarpine  5 mg Oral TID  . potassium chloride  40 mEq Oral BID  . senna  1 tablet Oral BID  . vancomycin  1,000 mg Intravenous Q12H    Continuous Infusions: . sodium chloride 20 mL/hr at 11/28/13 1138    Past Medical History  Diagnosis Date  . Sjogren's syndrome     cervical node  bx dr Annalee Genta  . HTN (hypertension)   . AOM (acute otitis media)     hx  of s/p tube right ear  . DVT (deep venous thrombosis) 1989    immediately postpartum   . Cellulitis 11/2013    LLE  . Complication of anesthesia     "couldn't wake up after my first c-section" (11/26/2013)  . Obstructive sleep apnea 01/09/03    ahi 79/hr  . OSA on CPAP   . GERD (gastroesophageal reflux disease)   . Arthritis     "hands"  (11/26/2013)    Past Surgical History  Procedure Laterality Date  . Cesarean section  1985; 1989  . Carpal tunnel release Bilateral 03/2003-05/2003  . Laparoscopic hysterectomy  12/12/94    bladder rupture complication  . Cervical biopsy      cervical node biopsy  . Carpal tunnel release Right 11/2003    "adjusted the 1st carpal tunnel release" (11/26/2013)  . Dilation and curettage of uterus    . Tubal ligation  1990  . Bladder surgery  1996    "bladder ruptured during hysterectomy OR; repaired; wore ostomy for ~ 3 months" (11/26/2013)  . Foot surgery Left 2000's    "fat lodged between bone and tendon" (11/26/2013)  . Ostomy take down  1997    "bladder ostomy" (11/26/2013)  . Myringotomy with tube placement Bilateral 07/2006  . Salivary gland surgery Right 07/2006    biopsy/notes 08/02/2006 (11/26/2013)  . Abdominal hysterectomy  1996    "started laparoscopically; had to open me up; repair bladder; complete hysterectomy; left an ovary" (11/26/2013)    Lloyd Huger MS RD LDN Clinical Dietitian [phone]

## 2013-11-28 NOTE — Progress Notes (Signed)
Family Medicine Teaching Service Daily Progress Note Intern Pager: [phone]  Patient name: Emma Curtis Medical record number: [id number] Date of birth: 07-Oct-1956 Age: 57 y.o. Gender: female  Primary Care Provider: Simone Curia, MD Consultants: None Code Status: Full  Pt Overview and Major Events to Date:   Assessment and Plan: Emma Curtis is a 57 y.o. female presenting with worsening cellulitis and leg swelling despite antibiotics. PMH is significant for HTN, HLD, OSA, DVT (postpartum), and Sjogren's.   #Cellulitis/leg swelling: Acute onset and w/o much  response to Doxycycline in outpt setting (only had 2 days of tx). Pt does not recall and puncture wounds or sites of infection other than possible spot on bottom of foot? Involvement of ankle and foot in clinic after 24 hrs of abx. No signs of systemic symptoms. No systemic symptoms concerning for sepsis. LE [place] on 11/25/13 w/o evidence of DVT. Has not gone past line of demarcation  - BCX x2 (likely to be negative given >24hrs PO ABX)  - IV vanc (No DM so unlikely to need pseudo coverage) - likely transition over to clinda tmw - IVF due to decreased PO, will d/c as beginning to take more PO - CBC w/ diff  - Tylenol or vicodin for pain  - consider imaging if L ankle does not improve.  #HTN: Well controlled on home regimen  - continue home norvasc, and HCTZ  - VS per floor routine   #HLD: Not on any lipid controlling medications. LDL 127 this morning, tcholesterol 176 -started ator 40, 10 yr risk 7.9% based on panel this morning - CMET   #Sjogren's: well controlled per pt.  - Continue home pilocarpine  FEN/GI: HHD, 1/2NS @100ml /hr PPx: HSQ  Disposition: further improvement on IV vanc, possible imaging  Subjective: Some nausea yesterday with meals but feels overall improved  Objective: Temp:  [98 F (36.7 C)-98.5 F (36.9 C)] 98.5 F (36.9 C) (12/18 0641) Pulse Rate:  [88-93] 91 (12/18 0641) Resp:   [17-18] 18 (12/18 0641) BP: (121-124)/(61-74) 121/64 mmHg (12/18 0641) SpO2:  [98 %-100 %] 100 % (12/18 0641) Physical Exam: Gen: Well NAD  HEENT: EOMI, MMM  Lungs: CTABL Nl WOB  Heart: RRR no MRG  Abd: NABS, NT, ND  Exts: L leg circ at calf 43cm adn R leg circ at calf 39cm.  Skin L leg erythematous and warm w/ small bulea formations diffusely. Erythema improved this morning, but still sig swelling  Laboratory:  Recent Labs Lab 11/26/13 1820 11/27/13 0500  WBC 11.7* 10.2  HGB 13.1 12.1  HCT 39.0 37.3  PLT 230 240    Recent Labs Lab 11/26/13 1820 11/27/13 0500  NA 139 139  K 3.6 3.1*  CL 101 102  CO2 25 27  BUN 11 10  CREATININE 0.74 0.76  CALCIUM 9.2 8.8  PROT 7.7  --   BILITOT 1.1  --   ALKPHOS 113  --   ALT 17  --   AST 21  --   GLUCOSE 98 99    Imaging/Diagnostic Tests:   Anselm Lis, MD 11/28/2013, 7:32 AM PGY-1, [facility] Family Medicine FPTS Intern pager: [phone], text pages welcome

## 2013-11-28 NOTE — Progress Notes (Signed)
FMTS attending Note Patient seen and examined by me today, discussed with resident team and I agree with Dr Nyra Capes assessment and plan.  Patient doing well; perhaps mild improvement in the area of erythema/induration on L leg.  Able to dorsi/plantarflex left ankle without difficulties.  No obvious area of fluctuance or coalescence.  Plan to continue vancomycin at this time; would want to see more improvement before transitioning to oral clindamycin.  Paula Compton, MD

## 2013-11-29 LAB — BASIC METABOLIC PANEL
CO2: 28 mEq/L (ref 19–32)
Calcium: 9.1 mg/dL (ref 8.4–10.5)
Chloride: 101 mEq/L (ref 96–112)
Creatinine, Ser: 0.75 mg/dL (ref 0.50–1.10)
GFR calc Af Amer: >90 mL/min (ref >90)
GFR calc non Af Amer: >90 mL/min (ref >90)

## 2013-11-29 MED ORDER — HYDROCERIN EX CREA
TOPICAL_CREAM | CUTANEOUS | Status: DC | PRN
  Administered 2013-11-29: 14:00:00 via TOPICAL
  Filled 2013-11-29: qty 113

## 2013-11-29 MED ORDER — SENNA 8.6 MG PO TABS: 1.0000 | ORAL_TABLET | Freq: Every day | ORAL | Status: DC

## 2013-11-29 MED ORDER — CLINDAMYCIN HCL 300 MG PO CAPS: 300.0000 mg | ORAL_CAPSULE | Freq: Four times a day (QID) | ORAL | Status: DC

## 2013-11-29 MED ORDER — CLINDAMYCIN HCL 300 MG PO CAPS
300.0000 mg | ORAL_CAPSULE | Freq: Four times a day (QID) | ORAL | Status: DC
  Administered 2013-11-29 – 2013-11-30 (×6): 300 mg via ORAL
  Filled 2013-11-29 (×9): qty 1

## 2013-11-29 NOTE — Progress Notes (Signed)
11/29/13 2150  BiPAP/CPAP/SIPAP  BiPAP/CPAP/SIPAP Pt Type Adult  Mask Type Nasal mask  Mask Size Medium  IPAP (Max IPAP= 20)  EPAP (Min EPAP= 6)  Oxygen Percent 21 %  BiPAP/CPAP/SIPAP CPAP  Patient Home Equipment No  Auto Titrate Yes  Patient has CPAP in the room with above settings, she said she can put it on herself and will call RT if she needs any help.

## 2013-11-29 NOTE — Progress Notes (Signed)
FMTS Attending Note Patient seen and examined by me, discussed with resident team and I agree with Dr Nyra Capes assessment and plan for today.  Patient's left leg is markedly improved today.  Agree with transition to oral clindamycin, with the hope that she may be discharged tomorrow if showing continued clinical improvement. Paula Compton, MD

## 2013-11-29 NOTE — Progress Notes (Signed)
Family Medicine Teaching Service Daily Progress Note Intern Pager: [phone]  Patient name: Emma Curtis Medical record number: [id number] Date of birth: [birth year] Age: 57 y.o. Gender: female  Primary Care Provider: Simone Curia, MD Consultants: None Code Status: Full  Pt Overview and Major Events to Date:   Assessment and Plan: Emma Curtis is a 57 y.o. female presenting with worsening cellulitis and leg swelling despite antibiotics. PMH is significant for HTN, HLD, OSA, DVT (postpartum), and Sjogren's.   #Cellulitis/leg swelling: Acute onset and w/o much  response to Doxycycline in outpt setting (only had 2 days of tx). Greatly improved this morning, transitioned over to PO clinda - BCX x2 neg - s/p IV vanc (No DM so unlikely to need pseudo coverage) - transitioned over to clinda will plan for an additional 10 days of abx treatment and f/up in clinic on 12/29 - CBC w/ diff  - Tylenol or vicodin for pain   #HTN: Well controlled on home regimen  - continue home norvasc, and HCTZ  - VS per floor routine   #HLD: Not on any lipid controlling medications. LDL 127, tcholesterol 176 -started ator 40, 10 yr risk 7.9% based on panel this morning - CMET for bump in ast/alt  #Sjogren's: well controlled per pt.  - Continue home pilocarpine  FEN/GI: HHD, KVO fluids PPx: HSQ  Disposition: tolerance of PO clinda today, likely d/c home early tmw  Subjective: Overall feels greatly improved, c/o some skin itching this morning  Objective: Temp:  [98.4 F (36.9 C)-98.6 F (37 C)] 98.6 F (37 C) (12/19 0615) Pulse Rate:  [81-90] 88 (12/19 0615) Resp:  [17-18] 18 (12/19 0615) BP: (108-143)/(68-73) 108/72 mmHg (12/19 0615) SpO2:  [96 %-98 %] 96 % (12/19 0615) Physical Exam: Gen: Well NAD  HEENT: EOMI, MMM  Lungs: CTABL Nl WOB  Heart: RRR no MRG  Abd: NABS, NT, ND  Exts: see skin exam, calf not measured today but left still appears larger than right, tender to touch  in mid shin region.  Skin L leg erythematous and warm w/ small bulla formations diffusely. Erythema continuing to recede from line of demarcation, but still sig swelling  Laboratory:  Recent Labs Lab 11/26/13 1820 11/27/13 0500 11/28/13 0930  WBC 11.7* 10.2 9.4  HGB 13.1 12.1 12.4  HCT 39.0 37.3 37.6  PLT 230 240 283    Recent Labs Lab 11/26/13 1820 11/27/13 0500 11/28/13 0930 11/29/13 0535  NA 139 139 137 136  K 3.6 3.1* 3.0* 3.7  CL 101 102 98 101  CO2 25 27 30 28   BUN 11 10 7 7   CREATININE 0.74 0.76 0.78 0.75  CALCIUM 9.2 8.8 9.1 9.1  PROT 7.7  --   --   --   BILITOT 1.1  --   --   --   ALKPHOS 113  --   --   --   ALT 17  --   --   --   AST 21  --   --   --   GLUCOSE 98 99 136* 109*    Imaging/Diagnostic Tests:   Anselm Lis, MD 11/29/2013, 12:49 PM PGY-1, [facility] Family Medicine FPTS Intern pager: [phone], text pages welcome

## 2013-11-30 MED ORDER — HYDROCODONE-ACETAMINOPHEN 5-325 MG PO TABS: 1.0000 | ORAL_TABLET | ORAL | Status: DC | PRN

## 2013-11-30 MED ORDER — CLOTRIMAZOLE 1 % EX CREA: 1.0000 "application " | TOPICAL_CREAM | Freq: Two times a day (BID) | CUTANEOUS | Status: DC

## 2013-11-30 MED ORDER — ATORVASTATIN CALCIUM 40 MG PO TABS: 40.0000 mg | ORAL_TABLET | Freq: Every day | ORAL | Status: DC

## 2013-11-30 NOTE — Discharge Summary (Signed)
Family Medicine Teaching [facility] Discharge Summary  Patient name: Emma Curtis Medical record number: [id number] Date of birth: [birth year] Age: 57 y.o. Gender: female Date of Admission: 11/26/2013  Date of Discharge: 11/30/13 Admitting Physician: Sanjuana Letters, MD  Primary Care Provider: Simone Curia, MD Consultants: None  Indication for Hospitalization: cellulitis  Discharge Diagnoses/Problem List:  Cellulitis Pre-diabetes HTN HLD Sjogrens   Disposition: home (with cane for assist)  Discharge Condition: improved  Discharge Exam:  BP 126/78  Pulse 86  Temp(Src) 97.9 F (36.6 C) (Oral)  Resp 18  SpO2 98% Gen: Well NAD, sitting up in bed with pillow round her neck HEENT: NCAT, EOMI, MMM  Lungs: CTAB, WOB  Heart: RRR no MRG  Abd: NABS, NT, ND  Exts: see skin exam, calf not measured today but left still appears larger than right, tender to touch in mid shin region.  Skin L leg erythematous and warm w/ small bulla formations diffusely. Erythema continuing to recede from line of demarcation, but still sig swelling  Brief Hospital Course:  Emma Curtis is a 57 y.o. female presenting with worsening cellulitis and leg swelling despite antibiotics. PMH is significant for HTN, HLD, OSA, DVT (postpartum), and Sjogren's.   #Cellulitis/leg swelling: Acute onset and w/o much response to Doxycycline in outpt setting (only had 2 days of tx). Pt does not recall and puncture wounds or sites of infection other than possible spot on bottom of foot? Involvement of ankle and foot in clinic after 24 hrs of abx. No signs of systemic symptoms. No systemic symptoms concerning for sepsis. LE [place] on 11/25/13 w/o evidence of DVT. Pt admitted for IV abx treatment and pain control. Bcx drawn but expected to be neg given >24hrs of PO abx. Iv vanc started (pt without DM so unlikely to need pseudomonal coverage). Serial leg exams trended improvement. Over course of 2 days  began to recede from line of demarcation. Given improvement pt gradually transitioned over to PO clinda. Was monitored for 24hrs on PO. No further imaging was indicated. Pt was d/c'd home with several tabs of norco for pain control taper, PO clinda to complete 14 day course and close f/up with PCP on the 29th when she will have completed course. She will have light work, duty restrictions from weight bearing and use a cane to ambulate around home.   #HTN: Well controlled on home regimen. Cont'd on home norvasc, and HCTZ   #HLD: Not on any lipid controlling medications. Lipid panel obtained in house. LDL 127,tcholesterol 176 . Patient started on ator 40, 10 yr risk 7.9% based on panel. D/c'd on this dose, would f.up LFTs as outpt  #Sjogren's: Well controlled per pt. Was continued on home pilocarpine  Issues for Follow Up:  1. Improvement on PO clinda- will have completed 14 day course by time of PCP aptmt 2. LFTs   Significant Procedures: None  Significant Labs and Imaging:   Recent Labs Lab 11/26/13 1820 11/27/13 0500 11/28/13 0930  WBC 11.7* 10.2 9.4  HGB 13.1 12.1 12.4  HCT 39.0 37.3 37.6  PLT 230 240 283    Recent Labs Lab 11/26/13 1820 11/27/13 0500 11/28/13 0930 11/29/13 0535  NA 139 139 137 136  K 3.6 3.1* 3.0* 3.7  CL 101 102 98 101  CO2 25 27 30 28   GLUCOSE 98 99 136* 109*  BUN 11 10 7 7   CREATININE 0.74 0.76 0.78 0.75  CALCIUM 9.2 8.8 9.1 9.1  ALKPHOS 113  --   --   --  AST 21  --   --   --   ALT 17  --   --   --   ALBUMIN 3.1*  --   --   --     Results/Tests Pending at Time of Discharge: None  Discharge Medications:    Medication List    STOP taking these medications       doxycycline 100 MG tablet  Commonly known as:  VIBRA-TABS      TAKE these medications       amLODipine 5 MG tablet  Commonly known as:  NORVASC  Take 1 tablet (5 mg total) by mouth daily.     atorvastatin 40 MG tablet  Commonly known as:  LIPITOR  Take 1 tablet (40 mg  total) by mouth daily at 6 PM.     clindamycin 300 MG capsule  Commonly known as:  CLEOCIN  Take 1 capsule (300 mg total) by mouth every 6 (six) hours. Take for an additional 10 days     clotrimazole 1 % cream  Commonly known as:  LOTRIMIN  Apply 1 application topically 2 (two) times daily.     hydrochlorothiazide 25 MG tablet  Commonly known as:  HYDRODIURIL  Take 1 tablet (25 mg total) by mouth daily.     HYDROcodone-acetaminophen 5-325 MG per tablet  Commonly known as:  NORCO/VICODIN  Take 1-2 tablets by mouth every 4 (four) hours as needed for moderate pain.     NON FORMULARY  cpap advanced 12 cwp     senna 8.6 MG Tabs tablet  Commonly known as:  SENOKOT  Take 1 tablet (8.6 mg total) by mouth daily.        Discharge Instructions: Please refer to Patient Instructions section of EMR for full details.  Patient was counseled important signs and symptoms that should prompt return to medical care, changes in medications, dietary instructions, activity restrictions, and follow up appointments.   Follow-Up Appointments:     Follow-up Information   Follow up with Simone Curia, MD On 12/09/2013. (at 3pm)    Specialty:  Family Medicine   Contact information:   [address] [place] [address] [phone]       Anselm Lis, MD 11/30/2013, 6:23 PM PGY-1, [facility] Family [facility]

## 2013-11-30 NOTE — Progress Notes (Signed)
FMTS Attending Note Patient seen and examined by me and discussed with resident team; her L leg cellulitis continues to improve after over 24 hours on oral Clindamycin.  Plan for discharge to home today with 10 additional days of oral clindamycin, outpatient follow up in that window for reassessment.  Note for work for the first half of this week, as she stands on her feet a lot in her job and should try to keep leg elevated in the coming week.  Paula Compton, MD

## 2013-11-30 NOTE — Progress Notes (Signed)
   CARE MANAGEMENT NOTE 11/30/2013  Patient:  JYRA, LAGARES   Account Number:  [id number]  Date Initiated:  11/30/2013  Documentation initiated by:  [facility]  Subjective/Objective Assessment:   adm: worsening cellulitis and leg swelling despite antibiotics     Action/Plan:   discharge planning   Anticipated DC Date:  11/30/2013   Anticipated DC Plan:  HOME/SELF CARE      DC Planning Services  CM consult      Choice offered to / List presented to:     DME arranged  [facility]      DME agency  [facility]        Status of service:  Completed, signed off Medicare Important Message given?   (If response is "NO", the following Medicare IM given date fields will be blank) Date Medicare IM given:   Date Additional Medicare IM given:    Discharge Disposition:  HOME/SELF CARE  Per UR Regulation:    If discussed at Long Length of Stay Meetings, dates discussed:    Comments:  11/30/13 14:30 CM spoke with pt in room who is waiting for cane to be delivered.  No other CM needs were communicated. Freddy Jaksch, BSN, CM [phone].

## 2013-12-03 LAB — CULTURE, BLOOD (ROUTINE X 2)
Culture: NO GROWTH
Culture: NO GROWTH

## 2013-12-09 ENCOUNTER — Ambulatory Visit (INDEPENDENT_AMBULATORY_CARE_PROVIDER_SITE_OTHER): Payer: BC Managed Care – PPO | Admitting: Family Medicine

## 2013-12-09 VITALS — BP 136/82 | HR 92 | Temp 98.5°F | Wt 173.0 lb

## 2013-12-09 DIAGNOSIS — L0291 Cutaneous abscess, unspecified: Secondary | ICD-10-CM

## 2013-12-09 DIAGNOSIS — L0390 Cellulitis, unspecified: Secondary | ICD-10-CM

## 2013-12-09 DIAGNOSIS — E785 Hyperlipidemia, unspecified: Secondary | ICD-10-CM

## 2013-12-09 LAB — COMPREHENSIVE METABOLIC PANEL
ALT: 21 U/L (ref 0–35)
AST: 16 U/L (ref 0–37)
Alkaline Phosphatase: 123 U/L — ABNORMAL HIGH (ref 39–117)
CO2: 28 mEq/L (ref 19–32)
Calcium: 9.6 mg/dL (ref 8.4–10.5)
Sodium: 139 mEq/L (ref 135–145)
Total Bilirubin: 0.6 mg/dL (ref 0.3–1.2)
Total Protein: 7.6 g/dL (ref 6.0–8.3)

## 2013-12-09 MED ORDER — BACITRACIN ZINC 500 UNIT/GM EX OINT: 1.0000 "application " | TOPICAL_OINTMENT | Freq: Two times a day (BID) | CUTANEOUS | Status: DC

## 2013-12-09 MED ORDER — HYDROCODONE-ACETAMINOPHEN 5-325 MG PO TABS: 1.0000 | ORAL_TABLET | Freq: Every day | ORAL | Status: DC | PRN

## 2013-12-09 MED ORDER — CLINDAMYCIN HCL 300 MG PO CAPS: 300.0000 mg | ORAL_CAPSULE | Freq: Four times a day (QID) | ORAL | Status: DC

## 2013-12-09 NOTE — Assessment & Plan Note (Addendum)
Afebrile, overall appears to be improving, hyperpigmentation is likely dead skin. However, still does not appear to have fully resolved and nearly at end of abx course. Inciting factor may have been athlete's foot. - Will prescribe one more week of clindamycin. - Follow up with me in 1 week to evaluate improvement. - Prescribed bacitracin ointment for areas of skin breakdown. - Instructed patient to change dressing BID (non-stick pads, gauze dressing, and ace-bandage to provide compression).  - Prescribed norco x 10 tabs for severe pain. Otherwise, aleve BID. - Return precautions reviewed. - Keep feet dry and use lotrimin cream to help treat athlete's foot as it develops.

## 2013-12-09 NOTE — Assessment & Plan Note (Signed)
Started lipitor in hospital, patient tolerating well. - Continue lipitor. - Check CMET for LFTs today. - F/u lipid panel in 6 months.

## 2013-12-09 NOTE — Progress Notes (Signed)
Patient ID: Emma Curtis, female   DOB: [birth year], 57 y.o.   MRN: [id number] Subjective:   CC: HFU  HPI:   1. Patient was recently discharged 12/20 after hospitalization for cellulitis of left leg. She has taken clindamycin and finishes 14 day course tomorrow. She reports "good and bad days" but has more good days with medication. She has not missed doses of clinda. She denies fevers/chills and reports overall improvement in erythema which was previously from just below knee to just above ankle. However, she worries about now-open areas that are draining and hyperpigmented area (under calf) and swollen area that appears to have pus in it (shin) that are tender. For pain, was on vicodin which she completed. Now pain is being managed with [facility] back and body but still has some "bad days."  Review of Systems - Per HPI.   PMH: Recent hospitalization for cellulitis    Objective:  Physical Exam BP 136/82  Pulse 92  Temp(Src) 98.5 F (36.9 C) (Oral)  Wt 173 lb (78.472 kg) GEN: NAD HEENT: Atraumatic, normocephalic, neck supple, EOMI, sclera clear  PULM: normal effort SKIN: Left leg with mild swelling and occasional skin breakdown from mid-shin to above ankle, 1+ pitting edema, some areas weeping, shin with swollen area that appears to have pus beneath but skin intact, tender; posterior leg under calf with hyperpigmented area 4cm diameter that is tender Mild skin breakdown in between toes. PSYCH: Mood and affect euthymic, normal rate and volume of speech NEURO: Awake, alert, no focal deficits grossly, normal speech  Assessment:     Emma Curtis is a 57 y.o. female here for hospital follow up for leg cellulitis.    Plan:     # See problem list and after visit summary for problem-specific plans.  # Health Maintenance: Not discussed. - Need to f/u prediabetes and HTN.  Follow-up: - Follow up in 1 week for follow-up of leg cellulitis.     Leona Singleton, MD [facility] Family Medicine

## 2013-12-09 NOTE — Patient Instructions (Signed)
Good to meet you.  For your cellulitis, keep taking clindamycin for another 7 days after finishing your current treatment. Use the dressing as we discussed, changing daily or twice daily. I prescribed bacitracin ointment to your pharmacy. You can get the no-stick pads and the gauze dressing at a supply store or drug store. Follow up with me or any available provider in about 1 week to see that this is gettng better. Seek immediate care if you develop fevers or worsened leg symptoms.  Cellulitis Cellulitis is an infection of the skin and the tissue under the skin. The infected area is usually red and tender. This happens most often in the arms and lower legs. HOME CARE   Take your antibiotic medicine as told. Finish the medicine even if you start to feel better.  Keep the infected arm or leg raised (elevated).  Put a warm cloth on the area up to 4 times per day.  Only take medicines as told by your doctor.  Keep all doctor visits as told. GET HELP RIGHT AWAY IF:   You have a fever.  You feel very sleepy.  You throw up (vomit) or have watery poop (diarrhea).  You feel sick and have muscle aches and pains.  You see red streaks on the skin coming from the infected area.  Your red area gets bigger or turns a dark color.  Your bone or joint under the infected area is painful after the skin heals.  Your infection comes back in the same area or different area.  You have a puffy (swollen) bump in the infected area.  You have new symptoms. MAKE SURE YOU:   Understand these instructions.  Will watch your condition.  Will get help right away if you are not doing well or get worse. Document Released: 05/16/2008 Document Revised: 05/29/2012 Document Reviewed: 02/13/2012 [facility] Patient Information 2014 [place], [place].

## 2013-12-16 ENCOUNTER — Encounter: Payer: Self-pay | Admitting: Family Medicine

## 2013-12-16 ENCOUNTER — Ambulatory Visit (INDEPENDENT_AMBULATORY_CARE_PROVIDER_SITE_OTHER): Payer: BC Managed Care – PPO | Admitting: Family Medicine

## 2013-12-16 VITALS — BP 133/80 | HR 77 | Temp 98.7°F | Ht 61.0 in | Wt 176.0 lb

## 2013-12-16 DIAGNOSIS — L0390 Cellulitis, unspecified: Secondary | ICD-10-CM

## 2013-12-16 DIAGNOSIS — L0291 Cutaneous abscess, unspecified: Secondary | ICD-10-CM

## 2013-12-16 MED ORDER — COLLAGENASE 250 UNIT/GM EX OINT: 1.0000 "application " | TOPICAL_OINTMENT | Freq: Every day | CUTANEOUS | Status: DC

## 2013-12-16 NOTE — Assessment & Plan Note (Signed)
Overall improved, no systemic symptoms, pain controlled without narcotic, pt on 2nd course of clindamycin with minimal side effects. Peeling skin makes me think strep was the likely organism of her cellulitis. Leg still swollen compared to right, 12/15 LE duplex negative for DVT. - Continue final 2-3 doses clindamycin. - Continue dressings/compression. - Use elastase once daily to eschar to help healing. - Continue lamisil cream and keeping feet dry with baby powder/corn starch. Use lysol on shoes to kill fungus. - F/u in 1 week or sooner PRN worsening symptoms. - F/u swelling.

## 2013-12-16 NOTE — Patient Instructions (Signed)
It was great to see you today.  Finish out your clindamycin course and keep doing the dressings with antibiotic ointment. Add the other ointment I prescribed to the dark areas to help that tissue turn over.  Call me or have the pharmacy call me if you are not able to get this or there is a cheaper alternative. Continue lamisil cream to your toes and corn starch/baby powder. Use lysol spray in the shoes to kill any fungus living there. Follow up with me in 1 week, or sooner if your leg worsens (worse redness, swelling, pain, or you develop fevers/chills or shortness of breath).

## 2013-12-16 NOTE — Progress Notes (Signed)
Patient ID: Michaelle BirksBarbara M Sahm, female   DOB: 01/20/1956, 58 y.o.   MRN: [id number] Subjective:   CC: Follow-up cellulitis  HPI:   Patient is taking 2nd course of clindamycin and reports overall improvement of left leg with decreased erythema, swelling, and pain, though she notes continued significant peeling. She denies fevers or chills. Reports mild loose stools. She has also been using dressings as described at last appt, lamisil for between toes, and has been keeping feet dry with corn starch and baby powder. Pain is mild, and when it gets mod-severe, she takes aleve which relieves it. She has not needed norco.   Review of Systems - Per HPI.   PMH: Taking clindamycin 2nd course     Objective:  Physical Exam BP 133/80  Pulse 77  Temp(Src) 98.7 F (37.1 C) (Oral)  Ht 5\' 1"  (1.549 m)  Wt 176 lb (79.833 kg)  BMI 33.27 kg/m2 GEN: NAD HEENT: Atraumatic, normocephalic, neck supple, EOMI, sclera clear  CV: 2+ DP pulses PULM: normal effort SKIN: Warm and well-perfused EXTR: Left LE with erythema at shin extending from 4-5cm below knee to 5cm above ankle, mild trace pitting edema from foot to under knee, mildly tender, no increased warmth, no calf tenderness, no open or weeping areas, 2+ DP pulse, full ROM PSYCH: Mood and affect euthymic, normal rate and volume of speech NEURO: Awake, alert, no focal deficits grossly, normal speech    Assessment:     Michaelle BirksBarbara M Dolberry is a 58 y.o. female with h/o cellulitis here for follow-up.    Plan:     # See problem list and after visit summary for problem-specific plans. - Needs f/u of prediabetes and HTN. - LFTs on statin WNL. Repeat 3156m-1year.  # Health Maintenance:   - Not discussed.   Follow-up: Follow up in 1 week for cellulitis.    Leona SingletonMaria T Alyx Mcguirk, MD [facility]

## 2013-12-17 ENCOUNTER — Telehealth: Payer: Self-pay | Admitting: Family Medicine

## 2013-12-17 NOTE — Telephone Encounter (Signed)
Pt called and needs Dr. Karie Schwalbe to call in something else. The medication Collagenase is not covered by her insurance and she can not afford it. Emma Jacobsonjw

## 2013-12-18 NOTE — Telephone Encounter (Signed)
Spoke with pharmacist and he states that medication is covered by her insurance but it goes towards her deductible.  He didn't think there was anything cheaper that worked the same.  Jazmin Hartsell,CMA

## 2013-12-18 NOTE — Telephone Encounter (Signed)
[employer], could you call the pharmacy to see if there is a medication that works like this that would be covered/affordable? I put an e-message in with the prescription and they have not responded.  Emma SingletonMaria T Naevia Unterreiner, MD

## 2013-12-18 NOTE — Telephone Encounter (Signed)
OK please let her know the pharmacist's reply. Sounds like we are out of options at the moment. We can follow up further at her next appt. In the meantime, she should continue everything else we discussed at our last visit.  Leona SingletonMaria T Mathew Postiglione, MD

## 2013-12-18 NOTE — Telephone Encounter (Signed)
LM with female to have pt return call.   Please relay message. Fleeger, Emma RochesterJessica Dawn

## 2013-12-19 ENCOUNTER — Telehealth: Payer: Self-pay | Admitting: Family Medicine

## 2013-12-19 NOTE — Telephone Encounter (Signed)
Patient calls again.  °

## 2013-12-19 NOTE — Telephone Encounter (Signed)
Needs letter stating dates she was unavailable for work which was 11-18-13 thru 12-19-13 Please advise Pt can come by to pick up letter

## 2013-12-19 NOTE — Telephone Encounter (Signed)
Will forward to MD to get the ok for this. Kawan Valladolid,CMA

## 2013-12-20 ENCOUNTER — Encounter: Payer: Self-pay | Admitting: Family Medicine

## 2013-12-20 NOTE — Telephone Encounter (Signed)
Looking at the chart, all we can say is [facility] for leg pain that she had since 12/13 per the note, and was hospitalized from 12/16-12/20. On the last 2 office visits after her hospitalization, cellulitis has still been healing, so it is appropriate per our records for her to have been out of work for this time.  Does that help? Can you write the note [facility] or do I need to?  Emma SingletonMaria T Meckenzie Balsley, MD

## 2013-12-20 NOTE — Telephone Encounter (Signed)
Thinking about this a little more, we can summarize the previous and say patient had a significant leg infection and had to be hospitalized. She had documented pain starting 12/13 (likely earlier) and infection still present even until most recent visit 1/5, at which point infection did not appear to have totally cleared, so it is appropriate for her to have had to be off work from 11/18/13-12/19/13 due to this severe infection which is now healing.  Emma SingletonMaria T Breion Novacek, MD

## 2013-12-20 NOTE — Telephone Encounter (Signed)
Letter written and asked Emma Curtis to call pt for pick up.  Emma SingletonMaria T Taysean Wager, MD

## 2013-12-20 NOTE — Telephone Encounter (Signed)
Spoke with pt and informed that letter was ready for pickup. Fleeger, [name]

## 2013-12-25 ENCOUNTER — Encounter: Payer: Self-pay | Admitting: Family Medicine

## 2013-12-25 ENCOUNTER — Ambulatory Visit (INDEPENDENT_AMBULATORY_CARE_PROVIDER_SITE_OTHER): Payer: BC Managed Care – PPO | Admitting: Family Medicine

## 2013-12-25 VITALS — BP 137/83 | HR 96 | Temp 99.1°F | Wt 176.0 lb

## 2013-12-25 DIAGNOSIS — K219 Gastro-esophageal reflux disease without esophagitis: Secondary | ICD-10-CM

## 2013-12-25 DIAGNOSIS — L0291 Cutaneous abscess, unspecified: Secondary | ICD-10-CM

## 2013-12-25 DIAGNOSIS — I10 Essential (primary) hypertension: Secondary | ICD-10-CM

## 2013-12-25 DIAGNOSIS — L0390 Cellulitis, unspecified: Secondary | ICD-10-CM

## 2013-12-25 MED ORDER — OMEPRAZOLE 40 MG PO CPDR: 40.0000 mg | DELAYED_RELEASE_CAPSULE | Freq: Every day | ORAL | Status: DC

## 2013-12-25 MED ORDER — HYDROCHLOROTHIAZIDE 25 MG PO TABS: 25.0000 mg | ORAL_TABLET | Freq: Every day | ORAL | Status: DC

## 2013-12-25 NOTE — Assessment & Plan Note (Signed)
Likely secondary to Rx and recent prolonged stress from infection Prilosec for 2 wk trial

## 2013-12-25 NOTE — Assessment & Plan Note (Signed)
Well controlled. Out of HCTZ Refill today

## 2013-12-25 NOTE — Patient Instructions (Signed)
You are doing very well overall Please let us know if the skin redness goes beyond my drawings or if you become symptomatic Please start the prilosec for excess acid production Please come back in 2 weeks or sooner if needed.

## 2013-12-25 NOTE — Assessment & Plan Note (Signed)
Resolving off ABX Continue compression stockings Skin markings made - if spreads beyond markings or symptomatic pt to call to reastart ABX

## 2013-12-25 NOTE — Progress Notes (Signed)
Emma Curtis is a 58 y.o. female who presents to [facility] today for CELLULITIS F/U   Cellulitis:  Improving since starting clindamycin. Still mildly painful, erythematous and swollen. Using compression wrap daily. Off ABX for 1 wk.   Reflux: started last week. Gingerail and eating healthier (kless fried or spicy foot) w/o benefit. Worse at nights adn after meals. Tums and rolaids w/ minimal benefit.   The following portions of the patient's history were reviewed and updated as appropriate: allergies, current medications, past medical history, family and social history, and problem list.  Patient is a nonsmoker.   Past Medical History  Diagnosis Date  . Sjogren's syndrome     cervical node  bx dr Annalee Genta  . HTN (hypertension)   . AOM (acute otitis media)     hx  of s/p tube right ear  . DVT (deep venous thrombosis) 1989    immediately postpartum   . Cellulitis 11/2013    LLE  . Complication of anesthesia     "couldn't wake up after my first c-section" (11/26/2013)  . Obstructive sleep apnea 01/09/03    ahi 79/hr  . OSA on CPAP   . GERD (gastroesophageal reflux disease)   . Arthritis     "hands" (11/26/2013)    ROS as above otherwise neg.    Medications reviewed. Current Outpatient Prescriptions  Medication Sig Dispense Refill  . amLODipine (NORVASC) 5 MG tablet Take 1 tablet (5 mg total) by mouth daily.  90 tablet  3  . atorvastatin (LIPITOR) 40 MG tablet Take 1 tablet (40 mg total) by mouth daily at 6 PM.  30 tablet  0  . bacitracin (RA BACITRACIN) ointment Apply 1 application topically 2 (two) times daily.  120 g  0  . clindamycin (CLEOCIN) 300 MG capsule Take 1 capsule (300 mg total) by mouth every 6 (six) hours. Take for an additional 7 days after completing your previous course.  28 capsule  0  . clotrimazole (LOTRIMIN) 1 % cream Apply 1 application topically 2 (two) times daily.  30 g  0  . collagenase (SANTYL) ointment Apply 1 application topically daily.  30 g  0  .  hydrochlorothiazide (HYDRODIURIL) 25 MG tablet Take 1 tablet (25 mg total) by mouth daily.  90 tablet  3  . HYDROcodone-acetaminophen (NORCO/VICODIN) 5-325 MG per tablet Take 1-2 tablets by mouth every 4 (four) hours as needed for moderate pain.  15 tablet  0  . HYDROcodone-acetaminophen (NORCO/VICODIN) 5-325 MG per tablet Take 1 tablet by mouth daily as needed for moderate pain.  10 tablet  0  . NON FORMULARY cpap advanced 12 cwp       . omeprazole (PRILOSEC) 40 MG capsule Take 1 capsule (40 mg total) by mouth daily. Take for 14 days  14 capsule  0  . senna (SENOKOT) 8.6 MG TABS tablet Take 1 tablet (8.6 mg total) by mouth daily.  30 each  0   No current facility-administered medications for this visit.    Exam:  BP 137/83  Pulse 96  Temp(Src) 99.1 F (37.3 C) (Oral)  Wt 176 lb (79.833 kg) Gen: Well NAD HEENT: EOMI,  MMM Skin: L leg w/ anterior erythema, trace edema, and mild induration. Mildly warm to palpation but nonttp. No fluctuance.   No results found for this or any previous visit (from the past 72 hour(s)).  A/P (as seen in Problem list)  Cellulitis Resolving off ABX Continue compression stockings Skin markings made - if spreads beyond  markings or symptomatic pt to call to reastart ABX   Esophageal reflux Likely secondary to Rx and recent prolonged stress from infection Prilosec for 2 wk trial  HYPERTENSION, BENIGN SYSTEMIC Well controlled. Out of HCTZ Refill today

## 2013-12-31 ENCOUNTER — Telehealth: Payer: Self-pay | Admitting: Family Medicine

## 2013-12-31 NOTE — Telephone Encounter (Signed)
I am catching up on paperwork from when I was out of town.  Please ask her why she needs this - is it temporary or permanent, and if so, for what reason? I prefer most of my patients to not have a disability parking card because walking actually benefits them, especially with her high blood pressure, exercise is good and necessary. Since I have only seen her so far for her cellulitis, I need her to tell me what her limitation is for walking to help me fill this out accurately and decide if it is medically appropriate.  Thanks!  Emma SingletonMaria T Deshana Rominger, MD

## 2013-12-31 NOTE — Telephone Encounter (Signed)
Pt would like to have BP medicine changed She would like to talk to dr about it

## 2013-12-31 NOTE — Telephone Encounter (Signed)
Pt says she brought in a paper to have completed for handicapped placard. She brought it to her appt with dr Konrad Doloresmerrell. She left it at the front desk for dr t. She wants to know the status Please advise

## 2013-12-31 NOTE — Telephone Encounter (Signed)
Have you already filled this form out? Jazmin Hartsell,CMA

## 2014-01-01 NOTE — Telephone Encounter (Signed)
Called and left message on voice mail of pt's mobile for her to call and clarify her question since I missed her. Thanks. Emma SingletonMaria T Shiraz Bastyr, MD

## 2014-01-02 NOTE — Telephone Encounter (Signed)
Called and left message on voicemail for mobile phone stating we could do 1 month placard and reassess at pt's appt with me 1/30. I think that is reasonable given her cellulitis that is still healing. I asked her to call us back and let me know if that is what she would like me to do; if so, I will fill out and leave at front for her to pick up.  ThxLeona Singleton.  Kimi Kroft T Talley Kreiser, MD

## 2014-01-02 NOTE — Telephone Encounter (Signed)
See previous phone note.  Waiting on callback from patient. Fleeger, [name]

## 2014-01-02 NOTE — Telephone Encounter (Signed)
Pt is in pain in her leg from cellulitis. She has to walk a long ways to work. She is working everyday and has to walk about 2-3 blocks from where she works She has an appt next week Jan 30 with Dr T.  She wil discuss this with Dr Karie Schwalbe nect week

## 2014-01-10 ENCOUNTER — Encounter: Payer: Self-pay | Admitting: Family Medicine

## 2014-01-10 ENCOUNTER — Telehealth: Payer: Self-pay | Admitting: *Deleted

## 2014-01-10 ENCOUNTER — Ambulatory Visit (INDEPENDENT_AMBULATORY_CARE_PROVIDER_SITE_OTHER): Payer: BC Managed Care – PPO | Admitting: Family Medicine

## 2014-01-10 VITALS — BP 120/70 | HR 79 | Temp 98.1°F | Wt 175.0 lb

## 2014-01-10 DIAGNOSIS — I878 Other specified disorders of veins: Secondary | ICD-10-CM | POA: Insufficient documentation

## 2014-01-10 DIAGNOSIS — E785 Hyperlipidemia, unspecified: Secondary | ICD-10-CM

## 2014-01-10 DIAGNOSIS — L0390 Cellulitis, unspecified: Secondary | ICD-10-CM

## 2014-01-10 DIAGNOSIS — I872 Venous insufficiency (chronic) (peripheral): Secondary | ICD-10-CM

## 2014-01-10 DIAGNOSIS — L0291 Cutaneous abscess, unspecified: Secondary | ICD-10-CM

## 2014-01-10 DIAGNOSIS — B353 Tinea pedis: Secondary | ICD-10-CM

## 2014-01-10 DIAGNOSIS — I10 Essential (primary) hypertension: Secondary | ICD-10-CM

## 2014-01-10 MED ORDER — ATORVASTATIN CALCIUM 40 MG PO TABS: 40.0000 mg | ORAL_TABLET | Freq: Every day | ORAL | Status: DC

## 2014-01-10 MED ORDER — TERBINAFINE HCL 1 % EX CREA: TOPICAL_CREAM | CUTANEOUS | Status: DC

## 2014-01-10 NOTE — Assessment & Plan Note (Addendum)
Likely cause of residual leg swelling.  - Prescribed compression stockings low-grade (15-3720mmHg). - Elevate legs. - Return precautions reviewed.

## 2014-01-10 NOTE — Patient Instructions (Signed)
Good to see you!  For your leg swelling, I think you have some venous insufficiency which means your veins are not pushing blood back up.  - Wear compression stockings. [facility]. - Call if any issues. - Keep legs elevated when possible. - I will mail you the handicapped permit form.  For your feet,  - Try lamisil. - Let me know if you could not get this and we can try something different.  Come back in 2-3 weeks or if you are doing better, in 1 month.

## 2014-01-10 NOTE — Assessment & Plan Note (Signed)
Healing and with no more erythema. Residual symptoms likely of venous stasis, with swelling and mild warmth. No calf tenderness, SOB, dyspnea and Well's criteria 0, making DVT unlikely. - No further abx at this time. - Treat swelling per above. - Residual pain: Mailed form for handicapped permit to pt, approving 2 months. - F/u 2w-6575month.

## 2014-01-10 NOTE — Assessment & Plan Note (Signed)
No symptoms of uncontrolled HTN, BP controlled today on recheck.  - Continue current therapy. - 1 episode of dizziness likely from dehydration per hx; recommended staying hydrated.

## 2014-01-10 NOTE — Progress Notes (Signed)
Patient ID: Emma BirksBarbara M Idrovo, female   DOB: 02/22/1956, 58 y.o.   MRN: [id number] Subjective:   CC: Follow-up of cellulitis  HPI:   Cellulitis: Patient has completed 2 courses of abx, most recently clindamycin PO which she completed ~3 weeks ago. The leg feels like it is healing but still is painful and swollen compared to the right leg. It hurts worse at night. Prior to cellulitis, the left leg was not swollen. Pain is achy and kept her up last night, with worse pain with touching the sheet. Pain is mostly in shin. Tolerates ambulation but has to rest partway from car to work due to pain. Elevates leg in office, which brings down swelling, but returns when she stands again. Has not used compression wrap 1 week.  Tinea pedis: Foot feels like it still has fungus despite lotrimin cream and baby powder. Feet are itchy and she has gotten more blisters on the sole of the foot than she has ever had before. Denies fevers/chills.   HTN: Denies headaches. Been having some dizziness over weekend and thinks it could be related to poor hydration. Denied other symptoms like chest pain, blurred vision, or palpitations. Took medication this morning.   HLD: Patient takes statin regularly and needs refill.   Review of Systems - Per HPI.   PMH: Medications reviewed. Needs refill on statin.    Objective:  Physical Exam BP 156/90, on recheck: BP 120/70  Pulse 79  Temp(Src) 98.1 F (36.7 C) (Oral)  Wt 175 lb (79.379 kg) GEN: NAD HEENT: Atraumatic, normocephalic, neck supple, EOMI, sclera clear  CV: 2+ DP pulse bilaterally PULM: normal effort SKIN: No rash or cyanosis; warm and well-perfused; hyperpigmentation left shin and calf EXTR: Left leg 2+ pitting edema to knees; no erythema, calf tenderness, or Homan's sign, no rash or cyanosis; small healing 2cm area of broken skin at mid-shin, left leg subtly warmer than right PSYCH: Mood and affect euthymic, normal rate and volume of speech NEURO: Awake,  alert, no focal deficits grossly, normal speech and gait    Assessment:     Emma BirksBarbara M Gibbard is a 58 y.o. female with h/o cellulitis here for f/u.    Plan:     # See problem list and after visit summary for problem-specific plans. - Refilled statin.  # Health Maintenance: Not discussed.  Follow-up: Follow up in 2w-2363month for follow-up of cellulitis.    Leona SingletonMaria T Vivian Neuwirth, MD [facility]

## 2014-01-10 NOTE — Assessment & Plan Note (Signed)
Continues despite lotrimin and baby powder. Could be initial cause of cellulitis. - Switched to lamisil cream. - Consider PO lamisil if this does not help. - Return 2w-1941month.

## 2014-01-10 NOTE — Telephone Encounter (Signed)
Completed handicap placard form mailed to patient address.

## 2014-02-24 ENCOUNTER — Other Ambulatory Visit: Payer: Self-pay | Admitting: *Deleted

## 2014-02-24 NOTE — Telephone Encounter (Signed)
Received refill request for Pilocarpine HCL 5 mg tab, take 1 tab by mouth three times a day.  Med is not list on current med list. [name], Reynolds Kittel L, RN

## 2014-02-26 MED ORDER — AMLODIPINE BESYLATE 5 MG PO TABS: 5.0000 mg | ORAL_TABLET | Freq: Every day | ORAL | Status: DC

## 2014-02-26 NOTE — Telephone Encounter (Signed)
I would need to know why she was needing this now and evaluate her prior to prescribing. It was only prescribed once (01/11/13) for post-viral glandular enlargement to try to reduce glandular swelling by inducing secretion, and then again 11/26/13 when it was prescribed and then discontinued in hospital. It can have unwanted side effects so I cannot prescribe with out eval and assured follow up.  Leona SingletonMaria T Shaheed Schmuck, MD

## 2014-03-04 ENCOUNTER — Other Ambulatory Visit: Payer: Self-pay | Admitting: *Deleted

## 2014-03-04 ENCOUNTER — Other Ambulatory Visit: Payer: Self-pay | Admitting: Family Medicine

## 2014-03-04 DIAGNOSIS — K219 Gastro-esophageal reflux disease without esophagitis: Secondary | ICD-10-CM

## 2014-03-04 MED ORDER — OMEPRAZOLE 40 MG PO CPDR: 40.0000 mg | DELAYED_RELEASE_CAPSULE | Freq: Every day | ORAL | Status: DC

## 2014-03-17 ENCOUNTER — Ambulatory Visit (INDEPENDENT_AMBULATORY_CARE_PROVIDER_SITE_OTHER): Payer: BC Managed Care – PPO | Admitting: Family Medicine

## 2014-03-17 ENCOUNTER — Encounter: Payer: Self-pay | Admitting: Family Medicine

## 2014-03-17 VITALS — BP 132/85 | HR 89 | Temp 97.9°F | Ht 61.0 in | Wt 180.0 lb

## 2014-03-17 DIAGNOSIS — M79605 Pain in left leg: Secondary | ICD-10-CM | POA: Insufficient documentation

## 2014-03-17 DIAGNOSIS — M79609 Pain in unspecified limb: Secondary | ICD-10-CM

## 2014-03-17 DIAGNOSIS — I872 Venous insufficiency (chronic) (peripheral): Secondary | ICD-10-CM

## 2014-03-17 DIAGNOSIS — I878 Other specified disorders of veins: Secondary | ICD-10-CM

## 2014-03-17 DIAGNOSIS — M7989 Other specified soft tissue disorders: Secondary | ICD-10-CM

## 2014-03-17 DIAGNOSIS — B353 Tinea pedis: Secondary | ICD-10-CM

## 2014-03-17 MED ORDER — GABAPENTIN 100 MG PO CAPS: 100.0000 mg | ORAL_CAPSULE | Freq: Every day | ORAL | Status: DC

## 2014-03-17 MED ORDER — LOSARTAN POTASSIUM 50 MG PO TABS: 50.0000 mg | ORAL_TABLET | Freq: Every day | ORAL | Status: DC

## 2014-03-17 MED ORDER — TERBINAFINE HCL 250 MG PO TABS: 250.0000 mg | ORAL_TABLET | Freq: Every day | ORAL | Status: DC

## 2014-03-17 NOTE — Assessment & Plan Note (Signed)
A: persistent. No new lesions to scrape P: Oral Lamisil x 2 weeks PCP f/u.

## 2014-03-17 NOTE — Progress Notes (Signed)
   Subjective:    Patient ID: Emma Curtis, female    DOB: 06/15/1956, 58 y.o.   MRN: [id number]  HPI 58 yo F presents for f/u visit:  1. LE edema: RLE since cellulitis in 12/14. Persistent dependent edema. Improves with supine position. Associated with anterior leg pain described as burning. Pain is worse at night. Patient takes ibuprofen which provides partial relief. Taking HCTZ. Does not like frequent urination and urgency symptoms x 2 months. Denies dysuria and hesitancy. Wears compression sock and elevates when possible.   Showed me a picture of her leg while in the hospital, erythematous blisters, concerning for herpes zoster.  She has a negative LE doppler while in the hospital.   2. R foot sores: gets painful blisters on sole of foot. Applying topical terbinafine for presumed tinea pedis (no scraping done)  w/o improvement. Still gets new lesions. No new ones now.   Soc hx: former smoker, quit 20 years ago.  Review of Systems As per HPI     Objective:   Physical Exam BP 132/85  Pulse 89  Temp(Src) 97.9 F (36.6 C) (Oral)  Ht 5\' 1"  (1.549 m)  Wt 180 lb (81.647 kg)  BMI 34.03 kg/m2 General appearance: alert, cooperative and no distress Extremities:  No edema on R 1+ LL edema with shiny epidermis and hemosiderin deposition related hyperpigmentation on the anterior surface of the leg.   L foot: scaly  Plaques on dorsum of foot.       Assessment & Plan:

## 2014-03-17 NOTE — Assessment & Plan Note (Addendum)
A: burning pain with elevation concerning for neuropathy. ? If initial presentation was herpes zoster at all?? Could she now be experiencing post-herpetic neuralgia?  P: Gabapentin q HS 100-300 mg for pain. Taper up as tolerated.

## 2014-03-17 NOTE — Assessment & Plan Note (Signed)
A: persistent dependent edema. Improves with elevation P: Continue compression Continue elevation as much as possible Stop norvasc (losartan instead, if she tolerates this well recommend losartan-HCTZ combo).

## 2014-03-17 NOTE — Patient Instructions (Signed)
Ms. Warren DanesCarpenter,  Thank you for coming in today.  Please make the medication changes we discussed.  Lamisil for foot blister for two weeks. Stop norvasc, start losartan for BP control. If you tolerate this well there is a combo option with HCTZ. Gabapentin for leg pain at night 1-3 tabs, start with one.   F/u with Dr/ T in two weeks.  Dr. Armen PickupFunches

## 2014-04-03 ENCOUNTER — Encounter: Payer: Self-pay | Admitting: Family Medicine

## 2014-04-03 ENCOUNTER — Ambulatory Visit (INDEPENDENT_AMBULATORY_CARE_PROVIDER_SITE_OTHER): Payer: BC Managed Care – PPO | Admitting: Family Medicine

## 2014-04-03 VITALS — BP 122/80 | HR 80 | Temp 98.3°F | Ht 61.0 in | Wt 178.0 lb

## 2014-04-03 DIAGNOSIS — K219 Gastro-esophageal reflux disease without esophagitis: Secondary | ICD-10-CM

## 2014-04-03 DIAGNOSIS — M79605 Pain in left leg: Secondary | ICD-10-CM

## 2014-04-03 DIAGNOSIS — M79609 Pain in unspecified limb: Secondary | ICD-10-CM

## 2014-04-03 DIAGNOSIS — I10 Essential (primary) hypertension: Secondary | ICD-10-CM

## 2014-04-03 DIAGNOSIS — B353 Tinea pedis: Secondary | ICD-10-CM

## 2014-04-03 LAB — POCT SKIN KOH: SKIN KOH, POC: NEGATIVE

## 2014-04-03 MED ORDER — TERBINAFINE HCL 250 MG PO TABS: 250.0000 mg | ORAL_TABLET | Freq: Every day | ORAL | Status: DC

## 2014-04-03 MED ORDER — OMEPRAZOLE 40 MG PO CPDR: 40.0000 mg | DELAYED_RELEASE_CAPSULE | Freq: Every day | ORAL | Status: DC

## 2014-04-03 NOTE — Progress Notes (Signed)
CC: F/u left leg pain  Subjective:    Patient ID: Emma Curtis, female    DOB: 06-20-1956, 58 y.o.   MRN: [id number]  HPI  Left leg pain: Follow up of left leg pain - Patient's left leg still hurts. She saw Dr Armen Pickup a few weeks back and was told to start lamisil PO for foot blistering (present since Jan, intermittent), but did not pick it up because pharmacy said it had not been received. On review, Dr Armen Pickup did send it there. She has new blisters at the bottom of her feet. Left leg is still dark, mildly more swollen than the right, and painful (throbbing, worse in evening after work), though overall it has been getting better since her cellulitis. It is difficult to wear compression stockings because they worsen the pain. Thinks she felt warm this weekend but otherwise no fevers/chills. Uses lamisil cream regularly but is not helping. Puts lotion on leg which she thinks helps. Takes Excedrin back and aleve for leg throbbing (and arthritis). Would like a handicapped sticker renewal because it is still painful to walk long distances. Gabapentin helps with the pain.  Acid reflux: Unsure why but has had worsening acid reflux "lately," even when she avoids what foods she think may cause it. Takes aleve 220mg  BID and excedrin back BID for left leg pain and arthritic pain (worse with rainy weather and first thing in morning). Omeprazole has been helping. Occasional nausea. No emesis. No abnormal bleeding.  HTN - At last visit, changed from HCTZ to losartan because did not like polyuria with HCTZ. Pt does not report any unwanted side effects.   SH: former smoker, quit 20 years ago.   Review of Systems Per HPI     Objective:   Physical Exam BP 122/80  Pulse 80  Temp(Src) 98.3 F (36.8 C) (Oral)  Ht 5\' 1"  (1.549 m)  Wt 178 lb (80.74 kg)  BMI 33.65 kg/m2 GEN: NAD, pleasant, well-groomed HEENT: Atraumatic, normocephalic, neck supple, EOMI, sclera clear  PULM: normal effort ABD: Soft,  nondistended EXTR: Right leg with no edema, tenderness, or erythema Left leg with trace pitting edema, moisturized with lotion, hyperpigmented on anterior and medial lower leg, tender anterior lower leg, no blistering on leg. Dorsum of foot with two dry blistered / opened areas with nu purlence or tenderness. Scaly.  PSYCH: Mood and affect euthymic, normal rate and volume of speech NEURO: Awake, alert, no focal deficits grossly, normal speech    Assessment & Plan:   Left leg pain: Discussed that most of her symptoms (hyperpigmentation, throbbing/burning pain) is likely due to chronic inflammatory changes in the leg and will likely improve but slowly.  - continue gabapentin 200mg  QHS. Add 100mg  at 5pm when pain is worst. - Add tylenol first thing in the morning. - Stay as active as possible.  - Temporary Handicapped placard form filled out for 3 more months. - Resent lamisil tablet to pharmacy. Take for 14 d. - KOH scraping>>negative. Still plan to tx for possible improvement. - If no improvement with lamisil, consider other blistering diseases and derm referral on f/u in 2-3 weeks. - Compress when able. Continue elevation. - Check LFT at future visit.  Acid reflux - Likely worsened with high daily NSAID use. No emesis or bleeding, though nausea present occasionally. Well-appearing with stable vitals today. - Try tylenol first thing in morning for arthritis. - Stop excedrin and aleve. - After 1 week, if abdominal symptoms improving and tylenol not helping with  arthritic pain, try 1 aleve daily in AM. - Return precautions reveiwed. - Continue omeprazole for 4 more weeks.  HTN - BP stable on change from HCTZ to losartan. - In future, f/u if still stable. Could switch to losartan-HCTZ combo.   Follow up in 2-3 weeks for re-evaluation of leg symptoms.  Leona Singleton, MD

## 2014-04-03 NOTE — Patient Instructions (Signed)
It was good to see you.  I have resent the lamisil tablet to your pharmacy for daily dosing for 14 days. Call me if you cannot get this. I think the pain and darkness is inflammation slowly going down. Continue compression (when able) and elevating the leg. Continue lotion. Take gabapentin 100mg  at 5pm and 200mg  before bedtime. Come back in 2-3 weeks. I will call you with results of the KOH.  For your stomach symptoms, this is most likely acid reflux.  Take omeprazole daily at a higher dose for 2 weeks. Stop taking aleve and exccedrin for now and take tylenol once daily in the mornings since this is the worst time for your arthritis. Come back in 2-3 weeks, or sooner if symptoms worsen.  Call me about renewing the parking pass for 2-3 months.  Best,  Emma SingletonMaria T Kalsey Lull, MD

## 2014-04-03 NOTE — Progress Notes (Signed)
Pt dropped off form to be signed regarding disability parking placard.

## 2014-04-04 ENCOUNTER — Encounter: Payer: Self-pay | Admitting: Family Medicine

## 2014-04-04 NOTE — Progress Notes (Signed)
Voice message left for pt informing her that form is completed and ready for pick up.  Clovis Puamika L Martin, RN

## 2014-04-04 NOTE — Assessment & Plan Note (Signed)
Discussed that most of her symptoms (hyperpigmentation, throbbing/burning pain) is likely due to chronic inflammatory changes in the leg and will likely improve but slowly.  - continue gabapentin 200mg  QHS. Add 100mg  at 5pm when pain is worst. - Add tylenol first thing in the morning. - Stay as active as possible.  - Temporary Handicapped placard form filled out for 3 more months. - Resent lamisil tablet to pharmacy. Take for 14 d. - KOH scraping>>negative. Still plan to tx for possible improvement. - If no improvement with lamisil, consider other blistering diseases and derm referral on f/u in 2-3 weeks. - Compress when able. Continue elevation. - Check LFT at future visit.

## 2014-04-04 NOTE — Progress Notes (Signed)
Clinical portion filled out and placed in MD box for completion. 

## 2014-04-04 NOTE — Assessment & Plan Note (Signed)
BP stable on change from HCTZ to losartan. - In future, f/u if still stable. Could switch to losartan-HCTZ combo.

## 2014-04-04 NOTE — Assessment & Plan Note (Signed)
Likely worsened reflux symptoms with high daily NSAID use. No emesis or bleeding, though nausea present occasionally. Well-appearing with stable vitals today. - Try tylenol first thing in morning for arthritis. - Stop excedrin and aleve. - After 1 week, if abdominal symptoms improving and tylenol not helping with arthritic pain, try 1 aleve daily in AM. - Return precautions reveiwed. - Continue omeprazole for 4 more weeks.

## 2014-04-04 NOTE — Progress Notes (Signed)
Signed and dated form and given back to Emma Curtis.   Thanks,  Emma SingletonMaria T Theresa Wedel, MD

## 2014-04-10 ENCOUNTER — Telehealth: Payer: Self-pay | Admitting: Family Medicine

## 2014-04-10 NOTE — Telephone Encounter (Signed)
Seen Dr. Karie Schwalbe last week and was having trouble with her left side. This week, patient states that a very large knot has appeared on her left elbow. She has no pain but it is growing fast. Please advise

## 2014-04-11 NOTE — Telephone Encounter (Signed)
Patient calls again today, is not experiencing pain with knot. Please contact patient today.

## 2014-04-11 NOTE — Telephone Encounter (Signed)
Left message on voice mail for patient to call back and receive the following message: If she has a new knot, it will be good for someone to see it to help with diagnosis. Please help her make an appointment early next week. If it chagnes or becomes very painful over the weekend, she should be seen immediately at Urgent Care.  Emma SingletonMaria T Kavion Mancinas, MD

## 2014-05-06 ENCOUNTER — Other Ambulatory Visit: Payer: Self-pay | Admitting: Family Medicine

## 2014-05-19 ENCOUNTER — Ambulatory Visit (INDEPENDENT_AMBULATORY_CARE_PROVIDER_SITE_OTHER): Payer: BC Managed Care – PPO | Admitting: Family Medicine

## 2014-05-19 ENCOUNTER — Encounter: Payer: Self-pay | Admitting: Family Medicine

## 2014-05-19 VITALS — BP 140/83 | HR 80 | Temp 99.0°F | Ht 61.0 in | Wt 179.9 lb

## 2014-05-19 DIAGNOSIS — M79605 Pain in left leg: Secondary | ICD-10-CM

## 2014-05-19 DIAGNOSIS — M6780 Other specified disorders of synovium and tendon, unspecified site: Secondary | ICD-10-CM

## 2014-05-19 DIAGNOSIS — M79609 Pain in unspecified limb: Secondary | ICD-10-CM

## 2014-05-19 DIAGNOSIS — E785 Hyperlipidemia, unspecified: Secondary | ICD-10-CM

## 2014-05-19 DIAGNOSIS — I10 Essential (primary) hypertension: Secondary | ICD-10-CM

## 2014-05-19 DIAGNOSIS — M7989 Other specified soft tissue disorders: Secondary | ICD-10-CM

## 2014-05-19 DIAGNOSIS — M6789 Other specified disorders of synovium and tendon, multiple sites: Secondary | ICD-10-CM

## 2014-05-19 MED ORDER — GABAPENTIN 100 MG PO CAPS: 100.0000 mg | ORAL_CAPSULE | Freq: Every evening | ORAL | Status: DC | PRN

## 2014-05-19 MED ORDER — HYDROCHLOROTHIAZIDE 25 MG PO TABS: 25.0000 mg | ORAL_TABLET | Freq: Every day | ORAL | Status: DC

## 2014-05-19 MED ORDER — LORATADINE 10 MG PO TABS: 10.0000 mg | ORAL_TABLET | Freq: Every day | ORAL | Status: DC | PRN

## 2014-05-19 MED ORDER — OMEPRAZOLE 40 MG PO CPDR: DELAYED_RELEASE_CAPSULE | ORAL | Status: DC

## 2014-05-19 MED ORDER — ATORVASTATIN CALCIUM 40 MG PO TABS: 40.0000 mg | ORAL_TABLET | Freq: Every day | ORAL | Status: DC

## 2014-05-19 MED ORDER — LOSARTAN POTASSIUM 50 MG PO TABS: 50.0000 mg | ORAL_TABLET | Freq: Every day | ORAL | Status: DC

## 2014-05-19 MED ORDER — FLUTICASONE PROPIONATE 50 MCG/ACT NA SUSP: NASAL | Status: DC

## 2014-05-19 MED ORDER — SENNA 8.6 MG PO TABS: 1.0000 | ORAL_TABLET | Freq: Every day | ORAL | Status: AC | PRN

## 2014-05-19 NOTE — Patient Instructions (Addendum)
Great to see you. Thank you for offering to send pictures. My email is [email or web address]. I think your elbow has a ganglion cyst. If this becomes bothersome, we can remove it. Watch for signs of infection (fever, red/heat, increased pain, drainage). Your leg looks better. Use lamisil if you have signs of fungal infection in your feet. Keep legs raised when possible and use compression stockings. Best,  Leona Singleton, MD

## 2014-05-22 DIAGNOSIS — M7022 Olecranon bursitis, left elbow: Secondary | ICD-10-CM | POA: Insufficient documentation

## 2014-05-22 NOTE — Progress Notes (Signed)
Subjective:   CC: Follow up left leg cellulitis. Left elbow knot.  HPI:   Follow up left leg cellulitis Patient reports swelling is still not gone in the left leg and foot still swells but overall symptoms are continuing to improve "lots." Dark spots that are likely hyperpigmentation due to inflammatory changes are slowly fading. Denies skin breakdown, ulcerations,fevers, or chills. Gabapentin makes her too drowsy. Aleve PM at night and 1 tylenol during the day htlps. She never took lamisil because foot peeling was improving as well.  Left elbow "knot" Pt reports that for a few months now she has had a knot on left dorsal elbow that initially grew but now has stayed the same size, ~1 cm diameter. It aches occasionally and is moderately tender with palpation. Denies erythema, heat, or purulence. She never had trauma to the area. She has had ganglion cysts in her hands before. 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    Review of Systems - Per HPI.   SH: Pt traveling to [place] this week. Former smoker.    Objective:  Physical Exam BP 140/83  Pulse 80  Temp(Src) 99 F (37.2 C) (Oral)  Ht 5\' 1"  (1.549 m)  Wt 179 lb 14.4 oz (81.602 kg)  BMI 34.01 kg/m2 GEN: NAD, pleasant EXTR:  Left lower extremity smooth with skin intact and very minimal / trace pitting edema. Nontender. Moderate amount of anterior tibial hyperpigmentation still present extending into calf. No erythema.  Left posterior elbow with 1cm nontender firm nodule that is nonerythematous and nondraining     Assessment:     Emma Curtis is a 58 y.o. female with h/o left leg cellulitis here for follow up and evaluation of left elbow nodule.    Plan:     Follow up left leg cellulitis No more cellulitic symptoms. Current swelling and hyperpigmentation likely a combination of inflammatory changes that will take time to resolve and some venous insufficiency. Foot skin KOH neg last visit. Pt did not take lamisil. -  Continue to monitor, keep legs moisturized and raised, and wear compression. - Use lamisil rx if any foot smyptoms return.  Left elbow nodule Likely ganglion cyst. Nontender with no signs of inflammation or infection. Pt does not want anything done at this time. Afebrile. Unlikely sarcoma given minimal size change since onset. Other nodular lesions remain in differential (erythema nodosum). - Expectant management for now. - Monitor for change. Return precautions reviewed. - If becomes uncomfortable, we can inject / aspirate in the office.  # Health Maintenance: Not discussed  Follow-up: Follow up as needed if left elbow nodule shows signs of infection or becomes uncomfortable.   Leona Singleton, MD [facility]

## 2014-05-22 NOTE — Assessment & Plan Note (Signed)
Likely ganglion cyst. Nontender with no signs of inflammation or infection. Pt does not want anything done at this time. Afebrile. Unlikely sarcoma given minimal size change since onset. Other nodular lesions remain in differential (erythema nodosum). - Expectant management for now. - Monitor for change. Return precautions reviewed. - If becomes uncomfortable, we can inject / aspirate in the office.

## 2014-05-22 NOTE — Assessment & Plan Note (Signed)
No more cellulitic symptoms. Current swelling and hyperpigmentation likely a combination of inflammatory changes that will take time to resolve and some venous insufficiency. Foot skin KOH neg last visit. Pt did not take lamisil. - Continue to monitor, keep legs moisturized and raised, and wear compression. - Use lamisil rx if any foot smyptoms return.

## 2014-06-25 ENCOUNTER — Encounter: Payer: Self-pay | Admitting: Family Medicine

## 2014-06-25 ENCOUNTER — Ambulatory Visit (INDEPENDENT_AMBULATORY_CARE_PROVIDER_SITE_OTHER): Payer: BC Managed Care – PPO | Admitting: Family Medicine

## 2014-06-25 VITALS — BP 147/92 | HR 92 | Temp 98.0°F | Wt 177.8 lb

## 2014-06-25 DIAGNOSIS — B9689 Other specified bacterial agents as the cause of diseases classified elsewhere: Secondary | ICD-10-CM | POA: Insufficient documentation

## 2014-06-25 DIAGNOSIS — Z9109 Other allergy status, other than to drugs and biological substances: Secondary | ICD-10-CM

## 2014-06-25 DIAGNOSIS — J019 Acute sinusitis, unspecified: Secondary | ICD-10-CM

## 2014-06-25 MED ORDER — FLUTICASONE PROPIONATE 50 MCG/ACT NA SUSP: NASAL | Status: DC

## 2014-06-25 MED ORDER — AMOXICILLIN-POT CLAVULANATE 875-125 MG PO TABS: 1.0000 | ORAL_TABLET | Freq: Two times a day (BID) | ORAL | Status: DC

## 2014-06-25 NOTE — Assessment & Plan Note (Signed)
Symptoms have been lasting for roughly three weeks. She reports a double sickening. No fever reported but feels hot at night.  - Augmentin for 7 days - Afrin for three days  - netti pot on a daily basis - Flonase 2 puffs BID for two weeks then once daily after  - continue allergy medication  - f/u if no improvement of symptoms after 7 days of ABX use.

## 2014-06-25 NOTE — Patient Instructions (Addendum)
Thank you for coming in,   I have sent in Augmentin for your sinus infection.   You can try to use a nasal saline rinse with a netti pot on a daily basis.   You can still try Afrin for three days.   Use Flonase 2 puffs twice a day for 2 weeks. After that you can use it once a day.   Continue to use an allergy pill as well.   Please follow up if your symptoms do not improve.    Please feel free to call with any questions or concerns at any time, at [phone]. --Dr. Jordan LikesSchmitz  Sinusitis Sinusitis is redness, soreness, and swelling (inflammation) of the paranasal sinuses. Paranasal sinuses are air pockets within the bones of your face (beneath the eyes, the middle of the forehead, or above the eyes). In healthy paranasal sinuses, mucus is able to drain out, and air is able to circulate through them by way of your nose. However, when your paranasal sinuses are inflamed, mucus and air can become trapped. This can allow bacteria and other germs to grow and cause infection. Sinusitis can develop quickly and last only a short time (acute) or continue over a long period (chronic). Sinusitis that lasts for more than 12 weeks is considered chronic.  CAUSES  Causes of sinusitis include:  Allergies.  Structural abnormalities, such as displacement of the cartilage that separates your nostrils (deviated septum), which can decrease the air flow through your nose and sinuses and affect sinus drainage.  Functional abnormalities, such as when the small hairs (cilia) that line your sinuses and help remove mucus do not work properly or are not present. SYMPTOMS  Symptoms of acute and chronic sinusitis are the same. The primary symptoms are pain and pressure around the affected sinuses. Other symptoms include:  Upper toothache.  Earache.  Headache.  Bad breath.  Decreased sense of smell and taste.  A cough, which worsens when you are lying flat.  Fatigue.  Fever.  Thick drainage from your nose,  which often is green and may contain pus (purulent).  Swelling and warmth over the affected sinuses. DIAGNOSIS  Your caregiver will perform a physical exam. During the exam, your caregiver may:  Look in your nose for signs of abnormal growths in your nostrils (nasal polyps).  Tap over the affected sinus to check for signs of infection.  View the inside of your sinuses (endoscopy) with a special imaging device with a light attached (endoscope), which is inserted into your sinuses. If your caregiver suspects that you have chronic sinusitis, one or more of the following tests may be recommended:  Allergy tests.  Nasal culture--A sample of mucus is taken from your nose and sent to a lab and screened for bacteria.  Nasal cytology--A sample of mucus is taken from your nose and examined by your caregiver to determine if your sinusitis is related to an allergy. TREATMENT  Most cases of acute sinusitis are related to a viral infection and will resolve on their own within 10 days. Sometimes medicines are prescribed to help relieve symptoms (pain medicine, decongestants, nasal steroid sprays, or saline sprays).  However, for sinusitis related to a bacterial infection, your caregiver will prescribe antibiotic medicines. These are medicines that will help kill the bacteria causing the infection.  Rarely, sinusitis is caused by a fungal infection. In theses cases, your caregiver will prescribe antifungal medicine. For some cases of chronic sinusitis, surgery is needed. Generally, these are cases in which sinusitis recurs  more than 3 times per year, despite other treatments. HOME CARE INSTRUCTIONS   Drink plenty of water. Water helps thin the mucus so your sinuses can drain more easily.  Use a humidifier.  Inhale steam 3 to 4 times a day (for example, sit in the bathroom with the shower running).  Apply a warm, moist washcloth to your face 3 to 4 times a day, or as directed by your caregiver.  Use  saline nasal sprays to help moisten and clean your sinuses.  Take over-the-counter or prescription medicines for pain, discomfort, or fever only as directed by your caregiver. SEEK IMMEDIATE MEDICAL CARE IF:  You have increasing pain or severe headaches.  You have nausea, vomiting, or drowsiness.  You have swelling around your face.  You have vision problems.  You have a stiff neck.  You have difficulty breathing. MAKE SURE YOU:   Understand these instructions.  Will watch your condition.  Will get help right away if you are not doing well or get worse. Document Released: 11/28/2005 Document Revised: 02/20/2012 Document Reviewed: 12/13/2011 [facility] Patient Information 2015 [place], [place]. This information is not intended to replace advice given to you by your health care provider. Make sure you discuss any questions you have with your health care provider.

## 2014-06-25 NOTE — Progress Notes (Signed)
   Subjective:    Patient ID: Emma BirksBarbara M Curtis, female    DOB: 09/09/1956, 58 y.o.   MRN: [id number]  HPI  Emma BirksBarbara M Petrella is here for sore throat, cough and runny nose. History of Sjogren's syndrome.   She has had cold type symptoms for three weeks. She started feeling better then felt like she got worse. She's had itchy, watery eyes, cough, post nasal trip and mucous production. She denies fever or emesis. She reports a history of sinus infections. She also has seasonal allergies. She has been taking cold medicine which help some but no resolution of symptoms. She has been taking flonase and an allergy pill on a daily basis.    Current Outpatient Prescriptions on File Prior to Visit  Medication Sig Dispense Refill  . atorvastatin (LIPITOR) 40 MG tablet Take 1 tablet (40 mg total) by mouth daily at 6 PM.  90 tablet  3  . bacitracin (RA BACITRACIN) ointment Apply 1 application topically 2 (two) times daily.  120 g  0  . fluticasone (FLONASE) 50 MCG/ACT nasal spray instill 1 spray into each nostril once daily  16 g  2  . gabapentin (NEURONTIN) 100 MG capsule Take 1 capsule (100 mg total) by mouth at bedtime as needed.  30 capsule  3  . hydrochlorothiazide (HYDRODIURIL) 25 MG tablet Take 1 tablet (25 mg total) by mouth daily.  90 tablet  3  . loratadine (CLARITIN) 10 MG tablet Take 1 tablet (10 mg total) by mouth daily as needed for allergies.  90 tablet  3  . losartan (COZAAR) 50 MG tablet Take 1 tablet (50 mg total) by mouth daily.  90 tablet  3  . NON FORMULARY cpap advanced 12 cwp       . omeprazole (PRILOSEC) 40 MG capsule take 1 capsule by mouth once daily  30 capsule  0  . senna (SENOKOT) 8.6 MG TABS tablet Take 1 tablet (8.6 mg total) by mouth daily as needed for mild constipation.  30 each  2  . terbinafine (LAMISIL) 1 % cream Apply to affected area BID until rash gone, then apply 2 more days.  30 g  1  . terbinafine (LAMISIL) 250 MG tablet Take 1 tablet (250 mg total) by mouth  daily.  14 tablet  0   No current facility-administered medications on file prior to visit.    Review of Systems See HPI     Objective:   Physical Exam BP 147/92  Pulse 92  Temp(Src) 98 F (36.7 C) (Oral)  Wt 177 lb 12.8 oz (80.65 kg) Gen: NAD, alert, cooperative with exam, well-appearing HEENT: NCAT, EOMI, no anterior or cervical LAD, no tonsillar exudates, TM's intact and clear, turbinates are erythematous and edematous b/l, frontal sinus tenderness on palpation.  Resp: CTABL, no wheezes, non-labored      Assessment & Plan:

## 2014-07-03 ENCOUNTER — Other Ambulatory Visit: Payer: Self-pay | Admitting: Family Medicine

## 2014-08-06 ENCOUNTER — Encounter: Payer: Self-pay | Admitting: Family Medicine

## 2014-08-06 ENCOUNTER — Ambulatory Visit (INDEPENDENT_AMBULATORY_CARE_PROVIDER_SITE_OTHER): Payer: BC Managed Care – PPO | Admitting: Family Medicine

## 2014-08-06 VITALS — BP 129/81 | HR 87 | Temp 98.1°F | Wt 180.0 lb

## 2014-08-06 DIAGNOSIS — M6789 Other specified disorders of synovium and tendon, multiple sites: Secondary | ICD-10-CM

## 2014-08-06 DIAGNOSIS — M71322 Other bursal cyst, left elbow: Secondary | ICD-10-CM

## 2014-08-06 DIAGNOSIS — M6749 Ganglion, multiple sites: Secondary | ICD-10-CM | POA: Diagnosis not present

## 2014-08-06 DIAGNOSIS — M7989 Other specified soft tissue disorders: Secondary | ICD-10-CM | POA: Diagnosis not present

## 2014-08-06 DIAGNOSIS — L738 Other specified follicular disorders: Secondary | ICD-10-CM | POA: Diagnosis not present

## 2014-08-06 DIAGNOSIS — M6780 Other specified disorders of synovium and tendon, unspecified site: Secondary | ICD-10-CM

## 2014-08-06 DIAGNOSIS — L853 Xerosis cutis: Secondary | ICD-10-CM

## 2014-08-06 NOTE — Assessment & Plan Note (Addendum)
No change in size but now causing discomfort. Needle aspiration unsuccessful in office today. Discussed with Dr. Lum Babe, and given the location deemed it would be best for general surgery referral for possible excision or re-attempt of aspiration. Patient will continue using aleve. Plan: NSAID therapy, gen surg referral.

## 2014-08-06 NOTE — Assessment & Plan Note (Signed)
Persistent symptoms likely from venous insufficiency, but cannot exclude DVT (very low suspicion). This was previously ultrasound duplex in December. No evidence of CHF contributing.  Plan: continue compression stockings.

## 2014-08-06 NOTE — Progress Notes (Signed)
Patient ID: Emma Curtis, female   DOB: May 14, 1956, 58 y.o.   MRN: [id number]   Subjective:    Patient ID: Emma Curtis, female    DOB: [birth year], 58 y.o.   MRN: [id number]  HPI  CC: knot on elbow  # Left elbow "knot":  Seen by PCP for this, at that time decided on just watching it  Has developed pain over the last month, bothersome to her since it is on the end of her elbow and hurts when she rests her elbow on desk  No swelling, no additional growth, no warmth to the area ROS: no fevers/chills, no numbness/tingling, does have some radiation of pain along arm  # Left leg swelling  Continued to have this problem, started primarily after leg cellulitis 11/2013  Using compression stockings when she gets home, elevates legs and that helps (mostly normal when she wakes in the morning, but immediately starts to get swollen as she gets up) ROS: No SOB  # Hand skin rash  Progressively worsening, located on DIP joints  Not painful  Using cocoa butter, has used other moisturizers in the past ROS: no joint swelling or pain  Review of Systems   See HPI for ROS. All other systems reviewed and are negative. Objective:  BP 129/81  Pulse 87  Temp(Src) 98.1 F (36.7 C) (Oral)  Wt 180 lb (81.647 kg) Vitals reviewed  General: NAD CV: RRR, normal s1/s2, no murmurs. 2+ radial, PT and DP pulses bilaterally Resp: CTAB, normal effort, no w/r/c Ext: left elbow approx 1cm freely movable soft mass just under dermis, no erythema, mildly tender. Left lower leg 2+ pitting edema, chronic hyperpigmentation scarring.     Needle aspiration procedure note  Written consent obtained, patient understood major risks of procedure including infection and bleeding. Left elbow prepped with iodine. A 21 gauge needle was inserted using topical anesthetic spray and aspiration attempted with no fluid drawn out. Repeat insertion of the needle x 2 with no fluid aspirated. Area hemostatic with pressure  and bandaid. Patient tolerated procedure well.  Tawni Carnes, MD 08/06/2014, 12:04 PM PGY-2, [facility]  Assessment & Plan:  See Problem List Documentation

## 2014-08-06 NOTE — Assessment & Plan Note (Signed)
Minor on distal fingers. Likely from sjogren's. No evidence of infection Plan: continue moisturizers and emollients.

## 2014-08-06 NOTE — Patient Instructions (Signed)
The general surgeon office will call you to make an appointment.  Keep the elbow clean today, use ice and some ibuprofen or tylenol to help.

## 2014-08-21 ENCOUNTER — Ambulatory Visit (INDEPENDENT_AMBULATORY_CARE_PROVIDER_SITE_OTHER): Payer: BC Managed Care – PPO | Admitting: General Surgery

## 2014-08-29 ENCOUNTER — Telehealth: Payer: Self-pay | Admitting: Family Medicine

## 2014-08-29 DIAGNOSIS — M7022 Olecranon bursitis, left elbow: Secondary | ICD-10-CM

## 2014-08-29 NOTE — Telephone Encounter (Signed)
Will forward to MD to place new referral.  Tichina Koebel,CMA

## 2014-08-29 NOTE — Telephone Encounter (Signed)
[facility] called and wanted [place] to know that they canceled patient appointment to see them and wanted [place] to know that we need to do a referral to a ortho doctor for this patient. Also they would like [place] to let the patient know that this appointment was canceled and she will have to wait to be seen by an ortho doctor. jw

## 2014-09-01 NOTE — Telephone Encounter (Signed)
Placed referral to orthopedics. Blue Team, please help patient to coordinate appointment.  Thank you.  Leona Singleton, MD

## 2014-09-04 ENCOUNTER — Other Ambulatory Visit: Payer: Self-pay | Admitting: *Deleted

## 2014-09-04 DIAGNOSIS — I10 Essential (primary) hypertension: Secondary | ICD-10-CM

## 2014-09-04 DIAGNOSIS — E785 Hyperlipidemia, unspecified: Secondary | ICD-10-CM

## 2014-09-04 DIAGNOSIS — Z9109 Other allergy status, other than to drugs and biological substances: Secondary | ICD-10-CM

## 2014-09-04 DIAGNOSIS — M7989 Other specified soft tissue disorders: Secondary | ICD-10-CM

## 2014-09-05 MED ORDER — ATORVASTATIN CALCIUM 40 MG PO TABS: 40.0000 mg | ORAL_TABLET | Freq: Every day | ORAL | Status: DC

## 2014-09-05 MED ORDER — OMEPRAZOLE 40 MG PO CPDR: DELAYED_RELEASE_CAPSULE | ORAL | Status: AC

## 2014-09-05 MED ORDER — FLUTICASONE PROPIONATE 50 MCG/ACT NA SUSP: NASAL | Status: DC

## 2014-09-05 MED ORDER — LOSARTAN POTASSIUM 50 MG PO TABS: 50.0000 mg | ORAL_TABLET | Freq: Every day | ORAL | Status: DC

## 2014-09-05 NOTE — Telephone Encounter (Signed)
Refused HCTZ because at 03/2014 visit we had changed from HCTZ to losartan. Leona Singleton, MD

## 2014-10-28 ENCOUNTER — Other Ambulatory Visit: Payer: Self-pay | Admitting: *Deleted

## 2014-10-28 DIAGNOSIS — I10 Essential (primary) hypertension: Secondary | ICD-10-CM

## 2014-10-28 NOTE — Telephone Encounter (Signed)
Declining because a year's worth prescribed in Sept 2015 for lipitor and June 2015 for HCTZ. Leona SingletonMaria T Derrico Zhong, MD

## 2014-10-30 ENCOUNTER — Other Ambulatory Visit: Payer: Self-pay | Admitting: *Deleted

## 2014-10-30 DIAGNOSIS — I10 Essential (primary) hypertension: Secondary | ICD-10-CM

## 2014-10-30 NOTE — Telephone Encounter (Signed)
Declining because filled for a year in Sept 2015. [name], any insight into why we keep getting this? Emma SingletonMaria T Moranda Billiot, MD

## 2015-01-28 ENCOUNTER — Telehealth: Payer: Self-pay | Admitting: Family Medicine

## 2015-01-28 DIAGNOSIS — I10 Essential (primary) hypertension: Secondary | ICD-10-CM

## 2015-01-28 NOTE — Telephone Encounter (Signed)
LMTCB. Will try again later. Need to know what BP med and if she has checked with [facility] to see if she has any refills. Cyndie MullLatoya Lucine Bilski, CMA.

## 2015-01-28 NOTE — Telephone Encounter (Signed)
Pt called and needs a refill on her BP medication. She has 4 days worth left. She has a appointment on 03/02 but will be out way before then. Please us [facility]. jw

## 2015-01-29 NOTE — Telephone Encounter (Signed)
LM for patient to call back.  Need verification on which BP medication she needs, also there should be enough refills for the ones on her list.  Jazmin Hartsell,CMA

## 2015-01-30 ENCOUNTER — Encounter: Payer: Self-pay | Admitting: Family Medicine

## 2015-01-30 ENCOUNTER — Ambulatory Visit (INDEPENDENT_AMBULATORY_CARE_PROVIDER_SITE_OTHER): Payer: BC Managed Care – PPO | Admitting: Family Medicine

## 2015-01-30 VITALS — BP 130/83 | HR 78 | Temp 98.2°F | Ht 61.0 in | Wt 175.6 lb

## 2015-01-30 DIAGNOSIS — M3500 Sicca syndrome, unspecified: Secondary | ICD-10-CM | POA: Diagnosis not present

## 2015-01-30 DIAGNOSIS — J0110 Acute frontal sinusitis, unspecified: Secondary | ICD-10-CM

## 2015-01-30 DIAGNOSIS — J329 Chronic sinusitis, unspecified: Secondary | ICD-10-CM | POA: Insufficient documentation

## 2015-01-30 DIAGNOSIS — J019 Acute sinusitis, unspecified: Secondary | ICD-10-CM | POA: Diagnosis not present

## 2015-01-30 DIAGNOSIS — B9689 Other specified bacterial agents as the cause of diseases classified elsewhere: Secondary | ICD-10-CM

## 2015-01-30 MED ORDER — AMOXICILLIN-POT CLAVULANATE 875-125 MG PO TABS: 1.0000 | ORAL_TABLET | Freq: Two times a day (BID) | ORAL | Status: DC

## 2015-01-30 NOTE — Telephone Encounter (Signed)
Patient in office today for congestion. Asked pt what BP med needed refilling and she stated HCTZ and on bottle at home says no refills. I asked if she [facility] to see if she had anymore left and she did not. She is requesting refill.  Cyndie MullLatoya Dorothia Passmore, CMA.

## 2015-01-30 NOTE — Assessment & Plan Note (Addendum)
Patient with 4 weeks of sinus congestion that has not improved with typical allergic measures. Will treat as bacterial infection given length of symptoms and little improvement. Potentially could be related to allergic rhinitis though would expect this would have improved with current measures. Augmentin sent to pharmacy. Given return precautions.

## 2015-01-30 NOTE — Assessment & Plan Note (Signed)
Glandular enlargement likely related to sjogrens syndrome vs post viral enlargement given progressive enlargement following viral illness. Patient missed recent follow up with ENT and is going to call them today to set up follow-up. Unlikely bacterial or sialidenitis given lack of tenderness on exam. No fluctuance to indicate abscess. No issues breathing or swallowing at this time. Given return precautions.

## 2015-01-30 NOTE — Progress Notes (Signed)
Patient ID: Emma HammersmithBarbara Curtis, female   DOB: 12/14/1955, 59 y.o.   MRN: [id number]  Marikay AlarEric Georganna Maxson, MD Phone: [phone]  Emma Curtis is a 59 y.o. female who presents today for same day appointment.  Sinus congestion: patient notes for the past 4 weeks she has had issues with sinus congestion, post nasal drip, sneezing, mucousy eyes. She notes swollen glands under her chin. She notes subjective fever, though has not checked her temperature. She denies sore throat and ear issues. She notes no dyspnea, pain with swallowing, or trouble swallowing. She has been using OTC cough medication. Has been taking zyrtec and flonase. Has been using nasal saline. She notes the rhinorrhea has improved some, though still blowing her nose and having sinus pressure that has not appreciably improved. She has a history of sjogren's and notes that in the past her glands have swollen related to this. She is followed by Dr Annalee GentaShoemaker for her sjogrens.  Patient is a nonsmoker.   ROS: Per HPI   Physical Exam Filed Vitals:   01/30/15 1124  BP: 130/83  Pulse: 78  Temp: 98.2 F (36.8 C)    Gen: Well NAD HEENT: PERRL,  MMM, normal TMs bilaterally, normal OP, enlarged submandibular glands with no tenderness or erythema, no flucutance, no cervical LAD Lungs: CTABL Nl WOB Heart: RRR  Exts: Non edematous BL  LE, warm and well perfused.    Assessment/Plan: Please see individual problem list.  Marikay AlarEric Damacio Weisgerber, MD [facility] PGY-3

## 2015-01-30 NOTE — Patient Instructions (Signed)
Nice to meet you. We are going to treat you for a sinus infection with augmentin.  If you develop fever, shortness of breath, or do not improve please seek medical attention. Please call Dr Thurmon FairShoemaker's office to schedule a follow-up appointment.

## 2015-02-01 NOTE — Telephone Encounter (Signed)
Please let her know that in April 2015 we had switched from HCTZ to losartan because she was having too much urination with HCTZ, which is why I have not refilled. She is overdue for follow up on her BP and we can discuss. Please ask if she has continued taking this since then despite us stopping it in April 2015.  Emma SingletonMaria T Jianni Batten, MD

## 2015-02-02 MED ORDER — HYDROCHLOROTHIAZIDE 25 MG PO TABS: 25.0000 mg | ORAL_TABLET | Freq: Every day | ORAL | Status: DC

## 2015-02-02 NOTE — Telephone Encounter (Signed)
Patient is aware.  She states that she had an appt with pcp on 3-2 but was called and it was cancelled.  She was worked in last week to follow up on this.  Patient will plan to make an appt in the next month or 2 for her bp. [facility] Hartsell,CMA

## 2015-02-02 NOTE — Addendum Note (Signed)
Addended by: Simone CuriaHEKKEKANDAM, [place] Lasseter T on: 02/02/2015 01:54 PM   Modules accepted: Orders

## 2015-02-02 NOTE — Telephone Encounter (Signed)
Spoke with patient and she states that she never stopped taking the HCTZ.  She was taking the losartan along with it.  She was here last week for "a check up and my BP was fine".  Informed patient that I would let you know and see about the refill. Kaid Seeberger,CMA

## 2015-02-02 NOTE — Telephone Encounter (Signed)
That is helpful to know. Please inform her I sent in the refill and we need a follow up visit for her hypertension within this month to see how she is doing.  Leona SingletonMaria T Kayton Ripp, MD

## 2015-02-04 ENCOUNTER — Other Ambulatory Visit: Payer: Self-pay | Admitting: Family Medicine

## 2015-02-04 ENCOUNTER — Encounter: Payer: Self-pay | Admitting: Family Medicine

## 2015-02-04 ENCOUNTER — Telehealth: Payer: Self-pay | Admitting: *Deleted

## 2015-02-04 ENCOUNTER — Ambulatory Visit (INDEPENDENT_AMBULATORY_CARE_PROVIDER_SITE_OTHER): Payer: BC Managed Care – PPO | Admitting: Family Medicine

## 2015-02-04 VITALS — BP 135/87 | HR 89 | Temp 98.1°F | Wt 179.0 lb

## 2015-02-04 DIAGNOSIS — R197 Diarrhea, unspecified: Secondary | ICD-10-CM | POA: Diagnosis not present

## 2015-02-04 NOTE — Telephone Encounter (Signed)
Pt called with complaints of diarrhea since Sunday.  Pt was given Augmentin 875-125 mg on 01/30/2015.  Pt is having 7-10 episodes of diarrhea in a day.  The stools per pt are watery.  Pt denies any blood in stool; stool color are brown.  Pt denies any fever.  Per Dr. Benjamin Stainhekkekandam; pt should be seen in clinic.  Appt today with same day provider at 3:15 PM.  [name], [name] L, RN

## 2015-02-04 NOTE — Assessment & Plan Note (Signed)
Likely secondary to antibiotic use. She has taken Augmentin for nearly a week. I advised her to discontinue use. I expect this to improve with cessation of antibiotics. Obtaining C diff given recent abx use.

## 2015-02-04 NOTE — Progress Notes (Signed)
   Subjective:    Patient ID: Emma Curtis, female    DOB: 06/14/1956, 59 y.o.   MRN: [id number]  HPI 59 year old female presents to the clinic today for evaluation of diarrhea.  1) Diarrhea  Patient endorses a four-day history of frequent, watery diarrhea.   Patient states that she has over 10 BM's a day.   She reports that the stool has no form .  She denies any associated incontinence.  Despite her diarrhea she has had good PO intake.  No associated nausea or vomiting.  No hematochezia, melena.  No relieving factors.  Of note, she was recently seen on  2/19 and was diagnosed with acute bacterial sinusitis. She was started on Augmentin at that time.  Review of Systems Per HPI    Objective:   Physical Exam Filed Vitals:   02/04/15 1511  BP: 135/87  Pulse: 89  Temp: 98.1 F (36.7 C)   Exam: General: well appearing, NAD. HEENT: MMM. Cardiovascular: RRR. Soft systolic murmur noted (2/6). Respiratory: CTAB. No rales, rhonchi, or wheeze. Abdomen: soft, nontender, nondistended. Normal bowel sounds. No organomegaly.    Assessment & Plan:  See Problem List

## 2015-02-05 ENCOUNTER — Telehealth: Payer: Self-pay | Admitting: *Deleted

## 2015-02-05 LAB — C. DIFFICILE GDH AND TOXIN A/B
C. difficile GDH: NOT DETECTED
C. difficile Toxin A/B: NOT DETECTED

## 2015-02-05 NOTE — Telephone Encounter (Signed)
Received call from pt, wanting to know the results of her stool cultures.  Spoke with MD Adriana Simas(Cook) labs are negative, she may continue Augmentin if she wants to, but she will continue to have diarrhea until course is finished.    Pt understands but wants to know if Dr. Adriana Simasook or Dr. Benjamin Stainhekkekandam would call her in something else. Joron Velis, [name]

## 2015-02-05 NOTE — Telephone Encounter (Signed)
She had pretty much completed the 7 day course of augmentin per Dr Patsey Bertholdook's note so she does not need anything else for sinusitis at this time unless sinusitis symptoms are persistent (in which case we could send in different antibiotic for additional 5 days). She could try some imodium for diarrhea as long as not having fevers/chills or abdominal pain. I will however defer to Dr Birdie SonsSonnenberg and Dr Adriana Simasook who saw her for the sinusitis and diarrhea, respectively.  Emma SingletonMaria T Tricha Ruggirello, MD

## 2015-02-06 MED ORDER — LOPERAMIDE HCL 2 MG PO TABS: 2.0000 mg | ORAL_TABLET | Freq: Four times a day (QID) | ORAL | Status: DC | PRN

## 2015-02-06 NOTE — Telephone Encounter (Signed)
Advised pt as directed below and verbalized understanding. She stated that diarrhea is getting better but still having trouble with sinusitis. She stated that sx are still the same when she came in last time including runny nose, cough with yellow phlegm, sneezing and throat hurting. She stopped taking antibiotic and would like to know if something can be sent in. Please advise. Reida Hem, CMA.

## 2015-02-06 NOTE — Telephone Encounter (Signed)
Per Dr. Adriana Simasook, he is not prescribing anything else since other abx will likely cause the same thing, pt can either take the abx prescribed and tough through diarrhea symptoms or not take it and let the virus run its course.

## 2015-02-06 NOTE — Telephone Encounter (Signed)
I would recommend discontinuation of antibiotics and use of imodium.

## 2015-02-06 NOTE — Addendum Note (Signed)
Addended by: Tommie SamsOOK, [place] Kading G on: 02/06/2015 05:11 PM   Modules accepted: Orders

## 2015-02-06 NOTE — Telephone Encounter (Signed)
Spoke with patient and explained to her the situation with abx causing the diarrhea and if she stopped one would get better but sinus sx would worsen.  She would like to finish her 7 day course of abx and also see if she can get a rx called in for imodium. Nicolai Labonte,CMA

## 2015-02-06 NOTE — Telephone Encounter (Signed)
Rx for Imodium sent.

## 2015-02-09 ENCOUNTER — Telehealth: Payer: Self-pay | Admitting: Family Medicine

## 2015-02-09 ENCOUNTER — Other Ambulatory Visit: Payer: Self-pay | Admitting: Family Medicine

## 2015-02-09 MED ORDER — LOPERAMIDE HCL 2 MG PO TABS: 2.0000 mg | ORAL_TABLET | Freq: Four times a day (QID) | ORAL | Status: DC | PRN

## 2015-02-09 NOTE — Telephone Encounter (Signed)
I will resend the imodium.  I do not feel that antibiotics are indicated.

## 2015-02-09 NOTE — Telephone Encounter (Signed)
Emma Curtis calling regarding the rx for her imodium and an antibiotic that Dr. Adriana Simasook was to have sent to pharmacy for her.  Only [facility] long term [facility].  [facility] [address]

## 2015-02-11 ENCOUNTER — Ambulatory Visit: Payer: BC Managed Care – PPO | Admitting: Family Medicine

## 2015-02-11 ENCOUNTER — Other Ambulatory Visit: Payer: Self-pay | Admitting: Family Medicine

## 2015-02-13 ENCOUNTER — Other Ambulatory Visit: Payer: Self-pay | Admitting: Family Medicine

## 2015-02-13 NOTE — Telephone Encounter (Signed)
Declining augmentin due to doctor who saw her for this stating he did not feel abx indicated at this time. She should follow up again in clinic if still having sinus issues. Emma SingletonMaria T Chaddrick Brue, MD

## 2015-06-01 ENCOUNTER — Encounter: Payer: Self-pay | Admitting: Family Medicine

## 2015-06-01 ENCOUNTER — Ambulatory Visit (INDEPENDENT_AMBULATORY_CARE_PROVIDER_SITE_OTHER): Payer: BC Managed Care – PPO | Admitting: Family Medicine

## 2015-06-01 VITALS — BP 131/76 | HR 89 | Temp 98.7°F | Wt 176.1 lb

## 2015-06-01 DIAGNOSIS — L03116 Cellulitis of left lower limb: Secondary | ICD-10-CM | POA: Diagnosis not present

## 2015-06-01 MED ORDER — CLINDAMYCIN HCL 300 MG PO CAPS: 300.0000 mg | ORAL_CAPSULE | Freq: Four times a day (QID) | ORAL | Status: DC

## 2015-06-01 NOTE — Patient Instructions (Signed)
Take clindamycin 300mg  four times a day for 7 days Return if not improving or worsening by later this week  Be well, Dr. Pollie Meyer   Cellulitis Cellulitis is an infection of the skin and the tissue beneath it. The infected area is usually red and tender. Cellulitis occurs most often in the arms and lower legs.  CAUSES  Cellulitis is caused by bacteria that enter the skin through cracks or cuts in the skin. The most common types of bacteria that cause cellulitis are staphylococci and streptococci. SIGNS AND SYMPTOMS   Redness and warmth.  Swelling.  Tenderness or pain.  Fever. DIAGNOSIS  Your health care provider can usually determine what is wrong based on a physical exam. Blood tests may also be done. TREATMENT  Treatment usually involves taking an antibiotic medicine. HOME CARE INSTRUCTIONS   Take your antibiotic medicine as directed by your health care provider. Finish the antibiotic even if you start to feel better.  Keep the infected arm or leg elevated to reduce swelling.  Apply a warm cloth to the affected area up to 4 times per day to relieve pain.  Take medicines only as directed by your health care provider.  Keep all follow-up visits as directed by your health care provider. SEEK MEDICAL CARE IF:   You notice red streaks coming from the infected area.  Your red area gets larger or turns dark in color.  Your bone or joint underneath the infected area becomes painful after the skin has healed.  Your infection returns in the same area or another area.  You notice a swollen bump in the infected area.  You develop new symptoms.  You have a fever. SEEK IMMEDIATE MEDICAL CARE IF:   You feel very sleepy.  You develop vomiting or diarrhea.  You have a general ill feeling (malaise) with muscle aches and pains. MAKE SURE YOU:   Understand these instructions.  Will watch your condition.  Will get help right away if you are not doing well or get  worse. Document Released: 09/07/2005 Document Revised: 04/14/2014 Document Reviewed: 02/13/2012 [facility] Patient Information 2015 [place], [place]. This information is not intended to replace advice given to you by your health care provider. Make sure you discuss any questions you have with your health care provider.

## 2015-06-01 NOTE — Progress Notes (Signed)
Patient ID: Emma Curtis, female   DOB: [birth year], 59 y.o.   MRN: [id number]  HPI:  Pt presents for a same day appointment to discuss L leg pain and swelling.  Noticed pain and swelling for about 2 weeks. No fevers. Generally feels fatigued, drained, no energy.decreased appetite but drinking lots of fluids. The pain is getting worse, a throbbing aching pain. Today began noticing red bumpy areas to L lateral shin.  Has hx of extensive cellulitis in this same spot requiring 6 day hospitalization in Dec 2014. Had failed outpatient doxycycline therapy, admitted on vancomycin and transitioned to PO clindamycin with eventual resolution. This feels similar to how cellulitis began that time.  ROS: See HPI  PMFSH: hx HLD, HTN, arthritis, sjogrens syndrome, OSA  PHYSICAL EXAM: BP 131/76 mmHg  Pulse 89  Temp(Src) 98.7 F (37.1 C)  Wt 176 lb 1.6 oz (79.878 kg) Gen: NAD, alert, cooperative, well appearing HEENT: NCAT Lungs: NWOB Neuro: grossly nonfocal speech normal Ext: L shin with areas of faint erythema and warmth along lateral aspect, difficult to measure as erythema is quite faint. Negative homans sign on left. No palpable cords or ankle swelling. L foot with 2+ DP pulse.  ASSESSMENT/PLAN:  1. Cellulitis: likely early cellulitis. Could also be due to erythema nodosum from sjogrens syndrome, but given hx of extensive cellulitis requiring IV abx therapy in the same area, with similar initial clinical presentation, will treat empirically for cellulitis with PO clindamycin QID x 7 days. Pt to return if worsening or not improving.  FOLLOW UP: F/u as needed if symptoms worsen or do not improve.   [place] J. Pollie Meyer, MD [facility]

## 2015-08-26 ENCOUNTER — Other Ambulatory Visit: Payer: Self-pay | Admitting: Family Medicine

## 2015-10-09 ENCOUNTER — Other Ambulatory Visit: Payer: Self-pay | Admitting: Obstetrics and Gynecology

## 2015-10-09 DIAGNOSIS — M7989 Other specified soft tissue disorders: Secondary | ICD-10-CM

## 2015-10-09 MED ORDER — LOSARTAN POTASSIUM 50 MG PO TABS: 50.0000 mg | ORAL_TABLET | Freq: Every day | ORAL | Status: DC

## 2015-10-09 NOTE — Telephone Encounter (Signed)
Needs refill on losartan. Took last one today.  Was sent a request in Sept but was not refilled. [facility]

## 2015-10-14 ENCOUNTER — Ambulatory Visit (INDEPENDENT_AMBULATORY_CARE_PROVIDER_SITE_OTHER): Payer: BC Managed Care – PPO | Admitting: Family Medicine

## 2015-10-14 ENCOUNTER — Encounter: Payer: Self-pay | Admitting: Family Medicine

## 2015-10-14 VITALS — BP 137/84 | HR 75 | Temp 98.6°F | Ht 61.0 in | Wt 176.7 lb

## 2015-10-14 DIAGNOSIS — S60429A Blister (nonthermal) of unspecified finger, initial encounter: Secondary | ICD-10-CM | POA: Diagnosis not present

## 2015-10-14 NOTE — Patient Instructions (Signed)
Thank you for coming to see me today. It was a pleasure. Today we talked about:   Skin blister: This does not appear infected currently. Please continue to use Aleve as needed. You can use a warm compress as well to help with symptoms. If your finger appears worse or symptoms do not improve over the next week or so, please return or call me.  If you have any questions or concerns, please do not hesitate to call the office at [phone].  Sincerely,  Jacquelin Hawkingalph Arleta Ostrum, MD

## 2015-10-14 NOTE — Progress Notes (Signed)
    Subjective   Emma HammersmithBarbara Curtis is a 59 y.o. female that presents for a same day visit  1. Thumb swelling: Patient remembers symptoms starting two days ago with swelling. She started having pain with greenish/black discoloration. She reports no recent injury but did cut and file her nails recently. No fevers, chills, nausea or vomiting.  She does have some weakness/tiredness that started three days ago. She has used peroxide and aleve to help with the pain.  ROS Per HPI  Social History  Substance Use Topics  . Smoking status: Former Smoker -- 1.00 packs/day for 23 years    Types: Cigarettes    Quit date: 10/12/1996  . Smokeless tobacco: Never Used  . Alcohol Use: Yes     Comment: 11/26/2013 "2 shots vodka or rum/wk; none since 1984"    Allergies  Allergen Reactions  . Lisinopril     REACTION: COUGH only    Objective   BP 137/84 mmHg  Pulse 75  Temp(Src) 98.6 F (37 C) (Oral)  Ht 5\' 1"  (1.549 m)  Wt 176 lb 11.2 oz (80.151 kg)  BMI 33.40 kg/m2  General: Well appearing Skin: Left thumb: lateral surface just adjacent to nail has a slightly swollen, tender, non-erythematous area with central area of thinned skin. Does not feel fluctuant.  Assessment and Plan   No orders of the defined types were placed in this encounter.    Blister: does not appear infected. Patient with no systemic symptoms  Conservative management  Return precautions discussed for development of infection

## 2015-11-01 ENCOUNTER — Other Ambulatory Visit: Payer: Self-pay | Admitting: Family Medicine

## 2015-12-21 ENCOUNTER — Other Ambulatory Visit: Payer: Self-pay | Admitting: Family Medicine

## 2015-12-21 DIAGNOSIS — M7989 Other specified soft tissue disorders: Secondary | ICD-10-CM

## 2016-01-04 ENCOUNTER — Other Ambulatory Visit: Payer: Self-pay | Admitting: Family Medicine

## 2016-01-11 MED ORDER — LOSARTAN POTASSIUM 50 MG PO TABS: 50.0000 mg | ORAL_TABLET | Freq: Every day | ORAL | Status: DC

## 2016-01-11 NOTE — Telephone Encounter (Signed)
Patient is due to meet new PCP and she has not had a wellness exam in over 2 years. I would like to go over her chronic health conditions and make sure they are stable. Please help patient schedule an appointment. Limited refills sent to pharmacy due to her needing an appointment.

## 2016-01-25 ENCOUNTER — Telehealth: Payer: Self-pay | Admitting: Obstetrics and Gynecology

## 2016-01-25 DIAGNOSIS — M7989 Other specified soft tissue disorders: Secondary | ICD-10-CM

## 2016-01-25 NOTE — Telephone Encounter (Signed)
[facility]ills [facility] to [facility] on the [address] of [address]. Cholesterol, blood pressure-both, HCTZ. Had been sent to a company with her employer but employer switched comapays

## 2016-01-25 NOTE — Telephone Encounter (Signed)
Will forward to MD to send [facility] to [facility]. Jhan Conery,CMA

## 2016-01-25 NOTE — Telephone Encounter (Signed)
LM for patient that on the corner of [address] is not a [facility] but a [facility].  Asked her to call me back to confirm she got this message and was wanting it at the location.  I also explain on the VM that the closest [facility] is on [address]. Emma Curtis,CMA

## 2016-01-26 MED ORDER — HYDROCHLOROTHIAZIDE 25 MG PO TABS: 25.0000 mg | ORAL_TABLET | Freq: Every day | ORAL | Status: DC

## 2016-01-26 MED ORDER — ATORVASTATIN CALCIUM 40 MG PO TABS: 40.0000 mg | ORAL_TABLET | Freq: Every day | ORAL | Status: DC

## 2016-01-26 MED ORDER — LOSARTAN POTASSIUM 50 MG PO TABS: 50.0000 mg | ORAL_TABLET | Freq: Every day | ORAL | Status: DC

## 2016-01-26 NOTE — Telephone Encounter (Signed)
Have never met patient. Please have her schedule appointment to discuss her chronic medical issues that need to be managed. Limited amount of medications given.

## 2016-01-27 NOTE — Telephone Encounter (Signed)
LM for patient ok per DPR and identified VM.  Please assist her in making an appt when she calls back.  Thanks [employer]

## 2016-02-19 ENCOUNTER — Ambulatory Visit (INDEPENDENT_AMBULATORY_CARE_PROVIDER_SITE_OTHER): Payer: BC Managed Care – PPO | Admitting: Family Medicine

## 2016-02-19 ENCOUNTER — Encounter: Payer: Self-pay | Admitting: Family Medicine

## 2016-02-19 VITALS — BP 134/80 | HR 97 | Temp 98.7°F | Wt 176.0 lb

## 2016-02-19 DIAGNOSIS — J302 Other seasonal allergic rhinitis: Secondary | ICD-10-CM

## 2016-02-19 MED ORDER — MOMETASONE FUROATE 50 MCG/ACT NA SUSP: 2.0000 | Freq: Every day | NASAL | Status: DC

## 2016-02-19 MED ORDER — LEVOCETIRIZINE DIHYDROCHLORIDE 5 MG PO TABS: 5.0000 mg | ORAL_TABLET | Freq: Every evening | ORAL | Status: DC

## 2016-02-19 NOTE — Progress Notes (Signed)
Subjective:     Patient ID: Emma HammersmithBarbara Curtis, female   DOB: 04/14/1956, 60 y.o.   MRN: [id number]  HPI Allergy: C/O nasal drainage, stuffiness, runny nose and lots of sneezing with dry eye x 3 weeks. She started coughing yesterday some but not as bad as her allergy symptoms. She attributed it to pollen and allergy season. She takes Flonase, Zyrtec and Clarithin daily with no improvement. She denies fever, no sick contact at home but some of her co-worker were sick. At her work place she was told they have mold in the building so she is not sure if this is contributing to her symptoms. No wheezing or chest tightness.  Current Outpatient Prescriptions on File Prior to Visit  Medication Sig Dispense Refill  . atorvastatin (LIPITOR) 40 MG tablet Take 1 tablet (40 mg total) by mouth daily at 6 PM. 90 tablet 3  . atorvastatin (LIPITOR) 40 MG tablet Take 1 tablet (40 mg total) by mouth daily. 60 tablet 0  . bacitracin (RA BACITRACIN) ointment Apply 1 application topically 2 (two) times daily. 120 g 0  . fluticasone (FLONASE) 50 MCG/ACT nasal spray USE 1 SPRAY IN EACH NOSTRIL DAILY 16 g 5  . gabapentin (NEURONTIN) 100 MG capsule Take 1 capsule (100 mg total) by mouth at bedtime as needed. 30 capsule 3  . hydrochlorothiazide (HYDRODIURIL) 25 MG tablet Take 1 tablet (25 mg total) by mouth daily. 60 tablet 0  . loperamide (IMODIUM A-D) 2 MG tablet Take 1 tablet (2 mg total) by mouth 4 (four) times daily as needed for diarrhea or loose stools. 30 tablet 0  . loratadine (CLARITIN) 10 MG tablet Take 1 tablet (10 mg total) by mouth daily as needed for allergies. 90 tablet 3  . losartan (COZAAR) 50 MG tablet Take 1 tablet (50 mg total) by mouth daily. 60 tablet 0  . NON FORMULARY cpap advanced 12 cwp     . omeprazole (PRILOSEC) 40 MG capsule take 1 capsule by mouth once daily 30 capsule 0  . senna (SENOKOT) 8.6 MG TABS tablet Take 1 tablet (8.6 mg total) by mouth daily as needed for mild constipation. 30 each 2   . terbinafine (LAMISIL) 1 % cream Apply to affected area BID until rash gone, then apply 2 more days. 30 g 1  . terbinafine (LAMISIL) 250 MG tablet Take 1 tablet (250 mg total) by mouth daily. 14 tablet 0   No current facility-administered medications on file prior to visit.   Past Medical History  Diagnosis Date  . Sjogren's syndrome (HCC)     cervical node  bx dr Annalee Gentashoemaker  . HTN (hypertension)   . AOM (acute otitis media)     hx  of s/p tube right ear  . DVT (deep venous thrombosis) (HCC) 1989    immediately postpartum   . Cellulitis 11/2013    LLE  . Complication of anesthesia     "couldn't wake up after my first c-section" (11/26/2013)  . Obstructive sleep apnea [facility]/09/03    [facility]  . OSA on CPAP   . GERD (gastroesophageal reflux disease)   . Arthritis     "hands" (11/26/2013)     Review of Systems  HENT: Positive for congestion, postnasal drip, rhinorrhea and sneezing. Negative for dental problem, ear pain, sinus pressure, sore throat, trouble swallowing and voice change.   Eyes: Negative for pain, redness and itching.  Respiratory: Negative.   Cardiovascular: Negative.   Gastrointestinal: Negative.   Neurological: Negative.  All other systems reviewed and are negative.  Filed Vitals:   02/19/16 0846  BP: 134/80  Pulse: 97  Temp: 98.7 F (37.1 C)  TempSrc: Oral  Weight: 176 lb (79.833 kg)  SpO2: 97%       Objective:   Physical Exam  Constitutional: She is oriented to person, place, and time. She appears well-developed. No distress.  HENT:  Head: Normocephalic.  Right Ear: Tympanic membrane, external ear and ear canal normal.  Left Ear: Tympanic membrane, external ear and ear canal normal.  Nose: Nose normal.  Mouth/Throat: Uvula is midline, oropharynx is clear and moist and mucous membranes are normal.  Eyes: Conjunctivae are normal. Pupils are equal, round, and reactive to light. Right eye exhibits no discharge. Left eye exhibits no discharge.   Neck: Neck supple.  Cardiovascular: Normal rate, regular rhythm and normal heart sounds.   No murmur heard. Pulmonary/Chest: Effort normal and breath sounds normal. No respiratory distress. She has no wheezes.  Lymphadenopathy:  No discrete or palpable LN but does have fullness of her submental area. Likely baseline.  Neurological: She is alert and oriented to person, place, and time.  Nursing note and vitals reviewed.      Assessment:     Seasonal allergic Rhintis     Plan:     Check problem list

## 2016-02-19 NOTE — Patient Instructions (Addendum)
It was nice seeing you today. I am sorry about your allergy. I am uncertain if mold exposure is contributing to your symptoms but it is a possibility. The first step in treatment is to avoid mold exposure. I will like to switch around your allergy medication. I have sent to your pharmacy Xyzal and Nasonex. Stop Flonase and other allergy pills you previously was on. If symptoms persist or cough worsens please call your PCP for chest xray and referral to an allergist or pulmonologist.  Allergic Rhinitis Allergic rhinitis is when the mucous membranes in the nose respond to allergens. Allergens are particles in the air that cause your body to have an allergic reaction. This causes you to release allergic antibodies. Through a chain of events, these eventually cause you to release histamine into the blood stream. Although meant to protect the body, it is this release of histamine that causes your discomfort, such as frequent sneezing, congestion, and an itchy, runny nose.  CAUSES Seasonal allergic rhinitis (hay fever) is caused by pollen allergens that may come from grasses, trees, and weeds. Year-round allergic rhinitis (perennial allergic rhinitis) is caused by allergens such as house dust mites, pet dander, and mold spores. SYMPTOMS  Nasal stuffiness (congestion).  Itchy, runny nose with sneezing and tearing of the eyes. DIAGNOSIS Your health care provider can help you determine the allergen or allergens that trigger your symptoms. If you and your health care provider are unable to determine the allergen, skin or blood testing may be used. Your health care provider will diagnose your condition after taking your health history and performing a physical exam. Your health care provider may assess you for other related conditions, such as asthma, pink eye, or an ear infection. TREATMENT Allergic rhinitis does not have a cure, but it can be controlled by:  Medicines that block allergy symptoms. These may  include allergy shots, nasal sprays, and oral antihistamines.  Avoiding the allergen. Hay fever may often be treated with antihistamines in pill or nasal spray forms. Antihistamines block the effects of histamine. There are over-the-counter medicines that may help with nasal congestion and swelling around the eyes. Check with your health care provider before taking or giving this medicine. If avoiding the allergen or the medicine prescribed do not work, there are many new medicines your health care provider can prescribe. Stronger medicine may be used if initial measures are ineffective. Desensitizing injections can be used if medicine and avoidance does not work. Desensitization is when a patient is given ongoing shots until the body becomes less sensitive to the allergen. Make sure you follow up with your health care provider if problems continue. HOME CARE INSTRUCTIONS It is not possible to completely avoid allergens, but you can reduce your symptoms by taking steps to limit your exposure to them. It helps to know exactly what you are allergic to so that you can avoid your specific triggers. SEEK MEDICAL CARE IF:  You have a fever.  You develop a cough that does not stop easily (persistent).  You have shortness of breath.  You start wheezing.  Symptoms interfere with normal daily activities.   This information is not intended to replace advice given to you by your health care provider. Make sure you discuss any questions you have with your health care provider.   Document Released: 08/23/2001 Document Revised: 12/19/2014 Document Reviewed: 08/05/2013 Elsevier Interactive Patient Education [employer]2016 Elsevier Inc.

## 2016-02-19 NOTE — Assessment & Plan Note (Signed)
Poor control with flonase and Zyrtec. Switch to Nasonex and Xyzal. I e-prescribed her meds. COncern for exposure to mold. Pulmonary exam otherwise normal. I recommended f/u if no improvement and to consider chest xray and or referral to pulmonologist/allergist to assess for mold exposure. She agreed with plan. Return precaution discussed.

## 2016-03-07 ENCOUNTER — Ambulatory Visit: Payer: BC Managed Care – PPO | Admitting: Obstetrics and Gynecology

## 2016-03-08 ENCOUNTER — Ambulatory Visit (INDEPENDENT_AMBULATORY_CARE_PROVIDER_SITE_OTHER): Payer: BC Managed Care – PPO | Admitting: Internal Medicine

## 2016-03-08 ENCOUNTER — Encounter: Payer: Self-pay | Admitting: Internal Medicine

## 2016-03-08 VITALS — BP 142/70 | HR 83 | Temp 98.2°F | Wt 172.0 lb

## 2016-03-08 DIAGNOSIS — R040 Epistaxis: Secondary | ICD-10-CM | POA: Insufficient documentation

## 2016-03-08 DIAGNOSIS — Z0000 Encounter for general adult medical examination without abnormal findings: Secondary | ICD-10-CM

## 2016-03-08 LAB — HEPATITIS C ANTIBODY: HCV AB: NEGATIVE

## 2016-03-08 MED ORDER — SALINE SPRAY 0.65 % NA SOLN: 1.0000 | NASAL | Status: AC | PRN

## 2016-03-08 NOTE — Assessment & Plan Note (Signed)
Pt is overdue for HIV and Hep C testing. She would like to have these done today. - HIV and Hep C screening tests have been ordered - Follow-up with PCP in the next month to have CPE with Pap

## 2016-03-08 NOTE — Patient Instructions (Addendum)
It was so nice to meet you!  I hope your sinuses start feeling better soon. I think your nose bleeds are coming from irritation of your sinuses. I think now that the mold has been removed, your sinuses will start to heal.  I would recommend nasal saline to moisturize your nose 5-10 times a day. You should also use some vaseline on the inside of your nose.  Please continue to take the Flonase and Zyrtec. You should wait 1 hour after using the Flonase before you use the nasal saline.   If you are not better in a week, please give me a call.  Please schedule an appointment to be seen for a complete physical exam and a pap smear.  -Dr. Nancy MarusMayo

## 2016-03-08 NOTE — Progress Notes (Signed)
[facility] Phone: [phone]  Subjective:  Pt presents with headaches and nosebleeds. She was seen in clinic on 3/10 with nasal drainage, stuffiness, runny nose, and sneezing. She was initially taking Flonase, Zyrtec, and Claritin. Given her continued allergy symptoms, she was switched to Nasonex and Xyzal at her last appointment. She never took the Nasonex because she couldn't afford it. She took the Xyzal for 4 days, but stopped taking it because she thought it was causing the nose bleeds. She continued taking the Flonase and Zyrtec. She then started noticing small blood clots every time she would blow her nose. She would also feel dizzy when she would blow her nose. She never had any blood dripping out of her nose. The blood clots are dark red, followed by some bright red streaks in the nasal drainage. She still has congestion. No cough. She has had mild yellow rhinorrhea. She is also having some mold in the building where she works. They cleaned out the mold 3 days ago. She has noticed that the blood clots have diminished since the mold was cleaned out. She has gone two days with no blood clots. No shortness of breath, no palpitations. No fevers, no chills. No bleeding gums, no easy bruising.  She describes the headaches as "nagging". The headaches are located in the front of her face. Her ears have started bothering her.   ROS: See HPI for pertinent positives and negatives Past Medical History- HTN, allergic rhinitis, Sjogren's syndrome Reviewed problem list.  Medications- reviewed and updated Current Outpatient Prescriptions  Medication Sig Dispense Refill  . atorvastatin (LIPITOR) 40 MG tablet Take 1 tablet (40 mg total) by mouth daily at 6 PM. 90 tablet 3  . atorvastatin (LIPITOR) 40 MG tablet Take 1 tablet (40 mg total) by mouth daily. 60 tablet 0  . bacitracin (RA BACITRACIN) ointment Apply 1 application topically 2 (two) times daily. 120 g 0  . fluticasone  (FLONASE) 50 MCG/ACT nasal spray USE 1 SPRAY IN EACH NOSTRIL DAILY 16 g 5  . gabapentin (NEURONTIN) 100 MG capsule Take 1 capsule (100 mg total) by mouth at bedtime as needed. 30 capsule 3  . hydrochlorothiazide (HYDRODIURIL) 25 MG tablet Take 1 tablet (25 mg total) by mouth daily. 60 tablet 0  . loperamide (IMODIUM A-D) 2 MG tablet Take 1 tablet (2 mg total) by mouth 4 (four) times daily as needed for diarrhea or loose stools. 30 tablet 0  . loratadine (CLARITIN) 10 MG tablet Take 1 tablet (10 mg total) by mouth daily as needed for allergies. 90 tablet 3  . losartan (COZAAR) 50 MG tablet Take 1 tablet (50 mg total) by mouth daily. 60 tablet 0  . NON FORMULARY cpap advanced 12 cwp     . omeprazole (PRILOSEC) 40 MG capsule take 1 capsule by mouth once daily 30 capsule 0  . senna (SENOKOT) 8.6 MG TABS tablet Take 1 tablet (8.6 mg total) by mouth daily as needed for mild constipation. 30 each 2  . sodium chloride (OCEAN) 0.65 % SOLN nasal spray Place 1 spray into both nostrils as needed for congestion. 60 mL 0  . terbinafine (LAMISIL) 1 % cream Apply to affected area BID until rash gone, then apply 2 more days. 30 g 1  . terbinafine (LAMISIL) 250 MG tablet Take 1 tablet (250 mg total) by mouth daily. 14 tablet 0   No current facility-administered medications for this visit.   Chief complaint-noted Family history reviewed for today's visit.  No changes. Social history- patient is a former smoker. Quit in 2007.  Objective: BP 142/70 mmHg  Pulse 83  Temp(Src) 98.2 F (36.8 C) (Oral)  Wt 172 lb (78.019 kg) Gen: NAD, alert, cooperative with exam HEENT: NCAT, EOMI, conjunctiva normal in color, TMs normal, nasal turbinates are erythematous and edematous, no obvious source of bleeding is visualized, oropharynx normal, sinuses are tender to palpation. Neck: FROM, supple, no cervical lymphadenopathy CV: RRR, no murmur Resp: CTABL, no wheezes, normal work of breathing Msk: Moves UE/LE  spontaneously Neuro: Alert and oriented, no gross deficits Skin: No rashes, no lesions Psych: Appropriate behavior  Assessment/Plan: Nose Bleeds: Pt states that she has noticed blood clots coming from her nose with some streaks of bright red blood. Likely related to her severe allergic rhinitis. On exam, nasal turbinates are very erythematous and edematous, but no obvious source of bleeding. Nose bleeds having improved since the mold has been removed from her office building.  - Recommend continued use of Flonase and Zyrtec. Advised Pt to aim the Flonase towards the outside of her nose instead of the center. - Advised nasal saline 5-10 times per day - Pt can also apply vaseline to the inside of the nostrils to help moisturize the mucosa. - Follow-up if symptoms do not improve - May need to consider referral to Allergist  Health Care Maintenance: Pt is overdue for HIV and Hep C testing. She would like to have these done today. - HIV and Hep C screening tests have been ordered - Follow-up with PCP in the next month to have CPE with Pap   Willadean CarolKaty Khianna Blazina, MD PGY-1

## 2016-03-08 NOTE — Assessment & Plan Note (Signed)
Pt states that she has noticed blood clots coming from her nose with some streaks of bright red blood. Likely related to her severe allergic rhinitis. On exam, nasal turbinates are very erythematous and edematous, but no obvious source of bleeding. Nose bleeds having improved since the mold has been removed from her office building. - Recommend continued use of Flonase and Zyrtec. Advised Pt to aim the Flonase towards the outside of her nose instead of the center. - Advised nasal saline 5-10 times per day - Pt can also apply vaseline to the inside of the nostrils to help moisturize the mucosa. - Follow-up if symptoms do not improve - May need to consider referral to Allergist

## 2016-03-09 ENCOUNTER — Encounter: Payer: Self-pay | Admitting: Internal Medicine

## 2016-03-09 LAB — HIV ANTIBODY (ROUTINE TESTING W REFLEX): HIV 1&2 Ab, 4th Generation: NONREACTIVE

## 2016-04-05 ENCOUNTER — Other Ambulatory Visit: Payer: Self-pay | Admitting: *Deleted

## 2016-04-05 MED ORDER — HYDROCHLOROTHIAZIDE 25 MG PO TABS: 25.0000 mg | ORAL_TABLET | Freq: Every day | ORAL | Status: DC

## 2016-04-05 NOTE — Telephone Encounter (Signed)
Patient is aware that she will need an appt and states that she will call back to schedule. Emma Curtis,CMA

## 2016-04-05 NOTE — Telephone Encounter (Signed)
Rx filled. Patient needs appointment for labs and follow up for further refills.  Katina Degreealeb M. Jimmey RalphParker, MD [facility] Resident PGY-2 04/05/2016 11:39 AM

## 2016-05-30 ENCOUNTER — Other Ambulatory Visit: Payer: Self-pay | Admitting: Obstetrics and Gynecology

## 2016-05-30 DIAGNOSIS — M7989 Other specified soft tissue disorders: Secondary | ICD-10-CM

## 2016-05-30 MED ORDER — ATORVASTATIN CALCIUM 40 MG PO TABS: 40.0000 mg | ORAL_TABLET | Freq: Every day | ORAL | Status: DC

## 2016-05-30 MED ORDER — LOSARTAN POTASSIUM 50 MG PO TABS: 50.0000 mg | ORAL_TABLET | Freq: Every day | ORAL | Status: DC

## 2016-05-30 MED ORDER — HYDROCHLOROTHIAZIDE 25 MG PO TABS: 25.0000 mg | ORAL_TABLET | Freq: Every day | ORAL | Status: DC

## 2016-05-30 NOTE — Telephone Encounter (Signed)
Needs refills on  HCTZ, losartan, atorastatin, flonose.  She has an appt July 7 but will be out of her meds this week.   [address] [address]

## 2016-05-30 NOTE — Telephone Encounter (Signed)
Needs appointment for additional refills. Tried to get her to schedule appointment 4 months ago and she has not followed up.

## 2016-06-17 ENCOUNTER — Ambulatory Visit (INDEPENDENT_AMBULATORY_CARE_PROVIDER_SITE_OTHER): Payer: BC Managed Care – PPO | Admitting: Obstetrics and Gynecology

## 2016-06-17 ENCOUNTER — Encounter: Payer: Self-pay | Admitting: Obstetrics and Gynecology

## 2016-06-17 VITALS — BP 132/81 | HR 88 | Ht 61.0 in | Wt 165.0 lb

## 2016-06-17 DIAGNOSIS — M19041 Primary osteoarthritis, right hand: Secondary | ICD-10-CM

## 2016-06-17 DIAGNOSIS — M3500 Sicca syndrome, unspecified: Secondary | ICD-10-CM

## 2016-06-17 DIAGNOSIS — I10 Essential (primary) hypertension: Secondary | ICD-10-CM | POA: Diagnosis not present

## 2016-06-17 DIAGNOSIS — G4730 Sleep apnea, unspecified: Secondary | ICD-10-CM

## 2016-06-17 DIAGNOSIS — E785 Hyperlipidemia, unspecified: Secondary | ICD-10-CM

## 2016-06-17 DIAGNOSIS — M19042 Primary osteoarthritis, left hand: Secondary | ICD-10-CM

## 2016-06-17 MED ORDER — ATORVASTATIN CALCIUM 40 MG PO TABS
40.0000 mg | ORAL_TABLET | Freq: Every day | ORAL | Status: DC
Start: 2016-06-17 — End: 2017-09-02

## 2016-06-17 MED ORDER — HYDROCHLOROTHIAZIDE 25 MG PO TABS: 25.0000 mg | ORAL_TABLET | Freq: Every day | ORAL | Status: DC

## 2016-06-17 MED ORDER — FLUTICASONE PROPIONATE 50 MCG/ACT NA SUSP: 1.0000 | Freq: Every day | NASAL | Status: AC

## 2016-06-17 NOTE — Progress Notes (Signed)
     Subjective: Chief Complaint  Patient presents with  . Follow-up    arthritis, scoliosis     HPI: Emma Curtis is a 60 y.o. presenting to clinic today to discuss the following:  #Arthritis: -continues to have arthritis pain in her hand -h/o carpal tunnel surgery -uses aleve for pain with good relief  #Med Refills -main reason for visit today  #Hypertension Blood pressure at home: does not monitor Blood pressure today: at goal Taking Meds: only taking HCTZ; stopped taking losartan Side effects: none ROS: Denies headache, dizziness, visual changes, nausea, vomiting, chest pain, abdominal pain or shortness of breath.  #Sjorgen's syndrome  Follows with Dr. Annalee GentaShoemaker ENT Denies current flare symptoms stable   #Sleep apnea -no longer using cpap -has lost weight which has helped with sleep -also states that using her Flonase and allergy medication has improved sleep distrubances  #HLD On statin  Health Maintenance: Due for colonoscopy and mammogram    ROS noted in HPI.  Past Medical, Surgical, Social, and Family History Reviewed & Updated per EMR. Smoking status - Former Smoker   Objective: BP 132/81 mmHg  Pulse 88  Ht 5\' 1"  (1.549 m)  Wt 165 lb (74.844 kg)  BMI 31.19 kg/m2 Vitals and nursing notes reviewed  Physical Exam  Constitutional: She is well-developed, well-nourished, and in no distress.  HENT:  Head: Normocephalic and atraumatic.  Mouth/Throat: Oropharynx is clear and moist.  Eyes: Conjunctivae and EOM are normal. Pupils are equal, round, and reactive to light.  Cardiovascular: Normal rate, regular rhythm and intact distal pulses.   Pulmonary/Chest: Effort normal and breath sounds normal.  Musculoskeletal: Normal range of motion. She exhibits no edema or tenderness.  Neurological: She is alert.  Skin: Skin is warm and dry.    Assessment/Plan: Please see problem based Assessment and Plan   Orders Placed This Encounter  Procedures    . Lipid panel  . COMPLETE METABOLIC PANEL WITH GFR    Meds ordered this encounter  Medications  . atorvastatin (LIPITOR) 40 MG tablet    Sig: Take 1 tablet (40 mg total) by mouth daily at 6 PM.    Dispense:  90 tablet    Refill:  3  . hydrochlorothiazide (HYDRODIURIL) 25 MG tablet    Sig: Take 1 tablet (25 mg total) by mouth daily.    Dispense:  60 tablet    Refill:  3  . fluticasone (FLONASE) 50 MCG/ACT nasal spray    Sig: Place 1 spray into both nostrils daily.    Dispense:  16 g    Refill:  5     Caryl AdaJazma Phelps, DO 06/17/2016, 11:37 AM PGY-3, [facility]

## 2016-06-17 NOTE — Patient Instructions (Signed)
Medications refilled and sent to pharmacy Call numbers and schedule mammogram and colonoscopy Continue to lose weight to help improve your health Will contact you about lab work   Follow-up with me in 3 months

## 2016-06-18 LAB — COMPLETE METABOLIC PANEL WITH GFR
ALK PHOS: 109 U/L (ref 33–130)
ALT: 16 U/L (ref 6–29)
AST: 15 U/L (ref 10–35)
Albumin: 4.2 g/dL (ref 3.6–5.1)
BUN: 15 mg/dL (ref 7–25)
CALCIUM: 9.7 mg/dL (ref 8.6–10.4)
CHLORIDE: 104 mmol/L (ref 98–110)
CO2: 28 mmol/L (ref 20–31)
Creat: 0.79 mg/dL (ref 0.50–0.99)
GFR, EST NON AFRICAN AMERICAN: 82 mL/min (ref >=60)
Glucose, Bld: 122 mg/dL — ABNORMAL HIGH (ref 65–99)
POTASSIUM: 3.4 mmol/L — AB (ref 3.5–5.3)
Sodium: 141 mmol/L (ref 135–146)
Total Bilirubin: 1 mg/dL (ref 0.2–1.2)
Total Protein: 7.3 g/dL (ref 6.1–8.1)

## 2016-06-18 LAB — LIPID PANEL
CHOL/HDL RATIO: 4.7 ratio (ref <=5.0)
CHOLESTEROL: 203 mg/dL — AB (ref 125–200)
HDL: 43 mg/dL — ABNORMAL LOW (ref >=46)
LDL Cholesterol: 133 mg/dL — ABNORMAL HIGH (ref <130)
TRIGLYCERIDES: 135 mg/dL (ref <150)
VLDL: 27 mg/dL (ref <30)

## 2016-06-22 ENCOUNTER — Telehealth: Payer: Self-pay | Admitting: Obstetrics and Gynecology

## 2016-06-22 ENCOUNTER — Telehealth: Payer: Self-pay | Admitting: Student

## 2016-06-22 NOTE — Assessment & Plan Note (Signed)
Symptoms have improved with weight lose and better control of patient's allergies. No longer using CPAP.

## 2016-06-22 NOTE — Telephone Encounter (Signed)
Called patient regarding request for pain medication. She did not answer.  Per the PCP note on 7/7 she was to continue on Aleve. She will need to discuss further pain medication with her PCP.  Letter was for massage was left at front

## 2016-06-22 NOTE — Telephone Encounter (Signed)
Will forward to MD to advise as well as covering MD. Burnard HawthorneJazmin Susannah Carbin,CMA

## 2016-06-22 NOTE — Telephone Encounter (Signed)
Pt called because she was seen last week and discussed with the doctor her arthritis. She had told the doctor at that time she didn't want any medication for this. She now had decided that she would like some medication for her arthritis. Can we call this in. Also she forgot to mention to the doctor that she needs a letter stating that she could have a massage at least twice a month because of her scoliosis.  Her insurance would cover this if the doctor said this is part of her treatment. Please call the patient with any questions. jw

## 2016-06-22 NOTE — Assessment & Plan Note (Signed)
BP at goal and stable. Just continue HCTZ. Discontinue Losartan. CMP collected today.

## 2016-06-22 NOTE — Assessment & Plan Note (Signed)
Collect lipid panel today. Continue statin.

## 2016-06-22 NOTE — Assessment & Plan Note (Signed)
Stable. Follows with ENT.

## 2016-06-22 NOTE — Assessment & Plan Note (Signed)
Known OA in multiple joints. Continue using Aleve as this provides good relief for patient. Discussed long-term chronic pain from OA.

## 2016-06-23 NOTE — Telephone Encounter (Signed)
Sent patient a Wellsite geologistmychart message. Jazmin Hartsell,CMA

## 2016-06-30 MED ORDER — MELOXICAM 5 MG PO CAPS: 5.0000 mg | ORAL_CAPSULE | Freq: Every day | ORAL | Status: DC

## 2016-06-30 NOTE — Telephone Encounter (Signed)
LM for patient on cell number ok per DPR. Denijah Karrer,CMA

## 2016-06-30 NOTE — Telephone Encounter (Signed)
Medication for arthritis sent to pharmacy. Please inform patient she needs to follow-up in clinic for additional refills. Also letter created for her massage therapy. She can come to clinic to pick it up. Thanks

## 2016-07-07 ENCOUNTER — Telehealth: Payer: Self-pay | Admitting: *Deleted

## 2016-07-07 ENCOUNTER — Emergency Department (HOSPITAL_COMMUNITY)
Admission: EM | Admit: 2016-07-07 | Discharge: 2016-07-07 | Disposition: A | Discharge status: Home / Self Care | Payer: BC Managed Care – PPO | Attending: Emergency Medicine | Admitting: Emergency Medicine

## 2016-07-07 ENCOUNTER — Encounter (HOSPITAL_COMMUNITY): Payer: Self-pay | Admitting: Emergency Medicine

## 2016-07-07 DIAGNOSIS — L237 Allergic contact dermatitis due to plants, except food: Secondary | ICD-10-CM | POA: Insufficient documentation

## 2016-07-07 DIAGNOSIS — R220 Localized swelling, mass and lump, head: Secondary | ICD-10-CM

## 2016-07-07 DIAGNOSIS — Z87891 Personal history of nicotine dependence: Secondary | ICD-10-CM | POA: Insufficient documentation

## 2016-07-07 DIAGNOSIS — I10 Essential (primary) hypertension: Secondary | ICD-10-CM | POA: Insufficient documentation

## 2016-07-07 DIAGNOSIS — R600 Localized edema: Secondary | ICD-10-CM | POA: Diagnosis present

## 2016-07-07 HISTORY — DX: Essential (primary) hypertension: I10

## 2016-07-07 MED ORDER — PREDNISONE 20 MG PO TABS: 60.0000 mg | ORAL_TABLET | Freq: Every day | ORAL | 0 refills | Status: DC

## 2016-07-07 MED ORDER — PREDNISONE 20 MG PO TABS
60.0000 mg | ORAL_TABLET | Freq: Once | ORAL | Status: AC
  Administered 2016-07-07: 60 mg via ORAL
  Filled 2016-07-07: qty 3

## 2016-07-07 NOTE — Telephone Encounter (Signed)
I was asked by Triage Nurse to evalkuate. She started having swelling around her left eye and cheek on Tuesday (48 hours ago). It is a little itchy and a little tender. She has some mild visual changes from that eye--she reports her LEFT eye is dominant ("her good eye") and she is not seeing quite as well out of it now with some blurriness. No pain with eye movement, denies fever, no headache.  She has plans to head out of town today on vacation.  EXAM: Soft tissue swelling of LEFT upper eyelid, area on cheek directly beneath eye and a little lateral to eye. TENDER to percussion over left maxillary bone but not temporal bone, no tenderness to percussion of orbut but some tenderness to palpation. PEERLA. EOMI and painless. Conjunctiva is nonicteric.  Potentially this is sift tissue swelling from early cellulitis or a response to ionsect bite which could be treated successfully ioutpatient, but I am concerned wit the visal changes, the fact thatit ha worsened over 48 hours rather than improved, she has some visual changes and TTPercussion of maxillary bone.  I explained this to her and recommmended she present to ED where they could do more thorough exam, potentially CT face, more complete eye exam, possibly slit lamp and evaluate for early shingles etc. She is amenable, especially as she had plans toleave town, I would want this to be evaluated prior to departure.Marland Kitchen

## 2016-07-07 NOTE — ED Provider Notes (Signed)
[facility]-EMERGENCY DEPT Provider Note   CSN: [id number] Arrival date & time: 07/07/16  4098  First Provider Contact:  First MD Initiated Contact with Patient 07/07/16 1018     By signing my name below, I, Freida Busman, attest that this documentation has been prepared under the direction and in the presence of non-physician practitioner, Santiago Glad, PA-C. Electronically Signed: Freida Busman, Scribe. 07/07/2016. 10:27 AM.  History   Chief Complaint Chief Complaint  Patient presents with  . Facial Swelling    The history is provided by the patient. No language interpreter was used.   HPI Comments:  Emma Curtis is a 60 y.o. female with a history of sjogren's who presents to the Emergency Department complaining of moderate left sided facial swelling x 2 days. She reports associated mild pain  and pruritus to the site. Pt notes she was gardening when she first noticed the swelling; states she was bitten a few times in the face while gardening.  She denies vision change, unusual throat swelling, difficulty swallowing, SOB, nausea, vomiting, and fever. Pt also notes itchy rash to her BUE x 2 days. . No alleviating factors noted.  She has not taken anything for symptoms prior to arrival.   Past Medical History:  Diagnosis Date  . AOM (acute otitis media)    hx  of s/p tube right ear  . Arthritis    "hands" (11/26/2013)  . Cellulitis 11/2013   LLE  . Complication of anesthesia    "couldn't wake up after my first c-section" (11/26/2013)  . DVT (deep venous thrombosis) (HCC) 1989   immediately postpartum   . GERD (gastroesophageal reflux disease)   . HTN (hypertension)   . Hypertension   . Obstructive sleep apnea 01/09/03   ahi 79/hr  . OSA on CPAP   . Sjogren's syndrome (HCC)    cervical node  bx dr shoemaker    Patient Active Problem List   Diagnosis Date Noted  . Health care maintenance 03/08/2016  . Seasonal allergic rhinitis 02/19/2016  . Xerosis of skin 08/06/2014    . Left leg pain 03/17/2014  . Venous stasis 01/10/2014  . Esophageal reflux 12/25/2013  . Hyperglycemia 11/26/2013  . Leg swelling 11/25/2013  . Sjogren's syndrome (HCC) 05/14/2010  . ALLERGIC RHINITIS 04/09/2009  . Osteoarthritis 01/13/2009  . Hyperlipemia 02/26/2007  . HYPERTENSION, BENIGN SYSTEMIC 02/08/2007  . MENOPAUSAL SYNDROME 02/08/2007  . Sleep apnea 02/08/2007    Past Surgical History:  Procedure Laterality Date  . ABDOMINAL HYSTERECTOMY  1996   "started laparoscopically; had to open me up; repair bladder; complete hysterectomy; left an ovary" (11/26/2013)  . BLADDER SURGERY  1996   "bladder ruptured during hysterectomy OR; repaired; wore ostomy for ~ 3 months" (11/26/2013)  . CARPAL TUNNEL RELEASE Bilateral 03/2003-05/2003  . CARPAL TUNNEL RELEASE Right 11/2003   "adjusted the 1st carpal tunnel release" (11/26/2013)  . CERVICAL BIOPSY     cervical node biopsy  . CESAREAN SECTION  1985; 1989  . DILATION AND CURETTAGE OF UTERUS    . FOOT SURGERY Left 2000's   "fat lodged between bone and tendon" (11/26/2013)  . LAPAROSCOPIC HYSTERECTOMY  12/12/94   bladder rupture complication  . MYRINGOTOMY WITH TUBE PLACEMENT Bilateral 07/2006  . OSTOMY TAKE DOWN  1997   "bladder ostomy" (11/26/2013)  . SALIVARY GLAND SURGERY Right 07/2006   biopsy/notes 08/02/2006 (11/26/2013)  . TUBAL LIGATION  1990    OB History    No data available       Home  Medications    Prior to Admission medications   Medication Sig Start Date End Date Taking? Authorizing Provider  atorvastatin (LIPITOR) 40 MG tablet Take 1 tablet (40 mg total) by mouth daily at 6 PM. 06/17/16  Yes Pincus Large, DO  fluticasone (FLONASE) 50 MCG/ACT nasal spray Place 1 spray into both nostrils daily. 06/17/16  Yes Pincus Large, DO  hydrochlorothiazide (HYDRODIURIL) 25 MG tablet Take 1 tablet (25 mg total) by mouth daily. 06/17/16  Yes Pincus Large, DO  Meloxicam 5 MG CAPS Take 5 mg by mouth daily. 06/30/16   Pincus Large, DO  NON FORMULARY Reported on 06/17/2016    Historical Provider, MD  omeprazole (PRILOSEC) 40 MG capsule take 1 capsule by mouth once daily Patient not taking: Reported on 06/17/2016 09/05/14   Leona Singleton, MD  senna (SENOKOT) 8.6 MG TABS tablet Take 1 tablet (8.6 mg total) by mouth daily as needed for mild constipation. Patient not taking: Reported on 06/17/2016 05/19/14   Leona Singleton, MD  sodium chloride (OCEAN) 0.65 % SOLN nasal spray Place 1 spray into both nostrils as needed for congestion. 03/08/16   Campbell Stall, MD    Family History Family History  Problem Relation Age of Onset  . Heart defect Brother     died age 29 heart/valve dz  . Melanoma Sister     died age 1 melanoma/sarcoma  . Cancer Maternal Aunt     dm and cancer  . Dementia Father     died age 17  . Cancer Maternal Grandfather     colon ca died  . Dementia Paternal Grandmother     died dementia    Social History Social History  Substance Use Topics  . Smoking status: Former Smoker    Packs/day: 1.00    Years: 23.00    Types: Cigarettes    Quit date: 10/12/1996  . Smokeless tobacco: Never Used  . Alcohol use Yes     Comment: 11/26/2013 "2 shots vodka or rum/wk; none since 1984"     Allergies   Lisinopril   Review of Systems Review of Systems  Constitutional: Negative for chills and fever.  HENT: Positive for facial swelling.   Respiratory: Negative for shortness of breath.   Cardiovascular: Negative for chest pain.  Gastrointestinal: Negative for nausea and vomiting.  Skin: Positive for rash.  All other systems reviewed and are negative.   Physical Exam Updated Vital Signs BP 147/80 (BP Location: Right Arm)   Pulse 81   Temp 98.3 F (36.8 C) (Oral)   Resp 20   SpO2 99%   Physical Exam  Constitutional: She is oriented to person, place, and time. She appears well-developed and well-nourished. No distress.  HENT:  Head: Normocephalic and atraumatic.  Right Ear:  Tympanic membrane and ear canal normal.  Left Ear: Tympanic membrane and ear canal normal.  Mouth/Throat: Oropharynx is clear and moist.  Mild edema to the cheeks bilaterally, slightly worse on the left; no erythema or warmth  No dental abscess No trismus No TTP of gingiva    Eyes: Conjunctivae are normal.  Neck: Normal range of motion. Neck supple.  No swelling of the neck  Cardiovascular: Normal rate, regular rhythm and normal heart sounds.   Pulmonary/Chest: Effort normal and breath sounds normal. No respiratory distress.  Airway is patent   Neurological: She is alert and oriented to person, place, and time.  Skin: Skin is warm and dry. Rash noted.  Erythematous  papular rash to bilateral forearms in linear pattern consistent with poison ivy; no drainage   Psychiatric: She has a normal mood and affect.  Nursing note and vitals reviewed.  ED Treatments / Results  DIAGNOSTIC STUDIES:  Oxygen Saturation is 99% on RA, normal by my interpretation.    COORDINATION OF CARE:  10:25 AM Will discharge with Benadryl and Prednisone. Discussed treatment plan with pt at bedside and pt agreed to plan.  Procedures   Medications Ordered in ED Medications - No data to display   Initial Impression / Assessment and Plan / ED Course  I have reviewed the triage vital signs and the nursing notes.  Pertinent labs & imaging results that were available during my care of the patient were reviewed by me and considered in my medical decision making (see chart for details).  Clinical Course   Patient presents today with swelling of the left side of her face.  Swelling only very mildly worse on the left when compared to the right.  No erythema or warmth to indicate Cellulitis.  She also has a rash of her forearms consistent with Poison Ivy.  She states that she felt an insect bite her in the face and that her face itches.  She is afebrile.  Therefore, feel that swelling is most likely related to  inflammation or an allergic reaction.  Do not feel that symptoms are consistent with an infectious process.  No tenderness to palpation of the gingiva or signs of a dental abscess.  Patient given Rx for Prednisone and instructed to take NSAIDs and Benadryl.  Stable for discharge.  Instructed to follow up with PCP.  Strict return precautions given.   Final Clinical Impressions(s) / ED Diagnoses    Final diagnoses:  None    New Prescriptions New Prescriptions   No medications on file  I personally performed the services described in this documentation, which was scribed in my presence. The recorded information has been reviewed and is accurate.     Santiago Glad, PA-C 07/07/16 1615    Pricilla Loveless, MD 07/08/16 [id number]

## 2016-07-07 NOTE — ED Triage Notes (Signed)
Patient states L facial swelling after working in the garden on Monday and Tuesday of this week.   Patient states "I think I got bit by a spider or got into some poison ivy or something".  Patient denies actually seeing a spider.   Patient does have some swelling to L face. Denies other symptoms.

## 2016-07-07 NOTE — Telephone Encounter (Signed)
Patient walked into clinic this morning requesting to see a provider for left side facial swelling that stated Tuesday night.  Patient is not sure if the swelling is caused by a bug bite of poison ivy.  Patient reported working outside in her flower bed Tuesday.  Patient denies any fever. Temp take 98.2.  Precept with Dr. Jennette Kettle; advised patient to go to ED for further evaluation.  Will forward chart to Dr. Jennette Kettle.  Clovis Pu, RN

## 2016-07-08 ENCOUNTER — Telehealth: Payer: Self-pay | Admitting: *Deleted

## 2016-07-08 NOTE — Telephone Encounter (Signed)
Prior Authorization received from [employer] for Vivlodex 5 mg. PA completed via phone; PA was approved via [employer] until 07/08/2016.  Reference number: [id number].   Clovis Pu, RN

## 2016-07-25 ENCOUNTER — Other Ambulatory Visit: Payer: Self-pay | Admitting: Obstetrics and Gynecology

## 2016-08-01 ENCOUNTER — Ambulatory Visit (INDEPENDENT_AMBULATORY_CARE_PROVIDER_SITE_OTHER): Payer: BC Managed Care – PPO | Admitting: Student

## 2016-08-01 VITALS — BP 144/66 | HR 89 | Temp 97.9°F | Wt 167.0 lb

## 2016-08-01 DIAGNOSIS — M25532 Pain in left wrist: Secondary | ICD-10-CM

## 2016-08-01 DIAGNOSIS — I10 Essential (primary) hypertension: Secondary | ICD-10-CM | POA: Diagnosis not present

## 2016-08-01 MED ORDER — MELOXICAM 15 MG PO TABS: 15.0000 mg | ORAL_TABLET | Freq: Every day | ORAL | 0 refills | Status: DC

## 2016-08-01 NOTE — Assessment & Plan Note (Signed)
Unclear etiology. Patient with history of carpal tunnel syndrome and osteoarthritis. Exam remarkable for tenderness to palpation over the distal end of ulna. Otherwise, she is neurovascularly intact. Tinel and Phalen signs negative. No identifiable trauma or injury. Unlikely infectious.  -Increased her meloxicam from 5 mg to 15 mg daily -Recommended using her wrist brace -If no improvement in a week or 2, will get an x-ray of her left arm.
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
## 2016-08-01 NOTE — Assessment & Plan Note (Signed)
Blood pressure mildly elevated to 144/66. We will defer this to PCP.

## 2016-08-01 NOTE — Patient Instructions (Signed)
It was great seeing you today! We have addressed the following issues today  1. Wrist pain: This could be all start try this or a new unnoticed injury. I have sent a prescription for meloxicam 15 mg to your pharmacy. Please pick this prescription and start taking it with food today. If no improvements in 1-2 weeks please come back. We may do an x-ray of your wrist at that time. I also recommend using your wrist brace.    If we did any lab work today, and the results require attention, either me or my nurse will get in touch with you. If everything is normal, you will get a letter in mail. If you don't hear from us in two weeks, please give us a call. Otherwise, I look forward to talking with you again at our next visit. If you have any questions or concerns before then, please call the clinic at [phone].  Please bring all your medications to every doctors visit   Sign up for My Chart to have easy access to your labs results, and communication with your Primary care physician.    Please check-out at the front desk before leaving the clinic.   Take Care,

## 2016-08-24 ENCOUNTER — Other Ambulatory Visit: Payer: Self-pay | Admitting: Student

## 2016-08-24 DIAGNOSIS — M25532 Pain in left wrist: Secondary | ICD-10-CM

## 2016-08-30 MED ORDER — MELOXICAM 15 MG PO TABS: 15.0000 mg | ORAL_TABLET | Freq: Every day | ORAL | 2 refills | Status: DC

## 2016-09-01 ENCOUNTER — Other Ambulatory Visit: Payer: Self-pay | Admitting: Student

## 2016-09-01 DIAGNOSIS — M25532 Pain in left wrist: Secondary | ICD-10-CM

## 2016-12-17 ENCOUNTER — Other Ambulatory Visit: Payer: Self-pay | Admitting: Obstetrics and Gynecology

## 2016-12-17 ENCOUNTER — Encounter: Payer: Self-pay | Admitting: Student

## 2016-12-17 DIAGNOSIS — M25532 Pain in left wrist: Secondary | ICD-10-CM

## 2016-12-19 ENCOUNTER — Other Ambulatory Visit: Payer: Self-pay | Admitting: Student

## 2016-12-19 DIAGNOSIS — M25532 Pain in left wrist: Secondary | ICD-10-CM

## 2016-12-19 MED ORDER — MELOXICAM 15 MG PO TABS: 15.0000 mg | ORAL_TABLET | Freq: Every day | ORAL | 1 refills | Status: DC

## 2016-12-30 ENCOUNTER — Other Ambulatory Visit: Payer: Self-pay | Admitting: Obstetrics and Gynecology

## 2016-12-30 DIAGNOSIS — M25532 Pain in left wrist: Secondary | ICD-10-CM

## 2017-01-07 NOTE — Progress Notes (Signed)
Opened by mistake. Refill to go to PCP

## 2017-02-15 ENCOUNTER — Other Ambulatory Visit: Payer: Self-pay | Admitting: Obstetrics and Gynecology

## 2017-02-23 ENCOUNTER — Other Ambulatory Visit: Payer: Self-pay | Admitting: Obstetrics and Gynecology

## 2017-02-25 ENCOUNTER — Other Ambulatory Visit: Payer: Self-pay | Admitting: Obstetrics and Gynecology

## 2017-05-26 ENCOUNTER — Encounter: Payer: Self-pay | Admitting: Family Medicine

## 2017-05-26 ENCOUNTER — Ambulatory Visit (INDEPENDENT_AMBULATORY_CARE_PROVIDER_SITE_OTHER): Payer: BC Managed Care – PPO | Admitting: Family Medicine

## 2017-05-26 DIAGNOSIS — M19042 Primary osteoarthritis, left hand: Secondary | ICD-10-CM | POA: Diagnosis not present

## 2017-05-26 DIAGNOSIS — M19041 Primary osteoarthritis, right hand: Secondary | ICD-10-CM | POA: Diagnosis not present

## 2017-05-26 DIAGNOSIS — M3500 Sicca syndrome, unspecified: Secondary | ICD-10-CM | POA: Diagnosis not present

## 2017-05-26 MED ORDER — DICLOFENAC SODIUM 75 MG PO TBEC: 75.0000 mg | DELAYED_RELEASE_TABLET | Freq: Two times a day (BID) | ORAL | 0 refills | Status: DC

## 2017-05-26 NOTE — Patient Instructions (Signed)
Start the voltaren gel.  See the ENT for your ears.  Come back to meet your new doctor soon.  Take care,  Dr Jimmey RalphParker

## 2017-05-26 NOTE — Progress Notes (Signed)
    Subjective:  Emma Curtis is a 61 y.o. female who [facility] today wiBarnetta Hammersmithth a chief complaint of hand pain.   HPI:  Hand Pain / Carpal Tunnel Syndrome Chronic problem. Has been seen by orthopedics in the past and eventually had a bilateral carpal tunnel release several years ago. Over the past few weeks, her hand pain and carpal tunnel symptoms have worsened. She is taking mobic daily. No fevers or chills. No weakness. She occasionally takes aleve. She is not wearing her wrist splints.   Ear Fullness Patient has a history of Sjogren's syndrome and has been followed at ENT for this. Over the past several weeks she has noticed worsening hearing and a full sensation in her ears bilaterally. She has not seen ENT for over a year. No treatments tried. No alleviating or aggravating factors.   ROS: Per HPI  PMH: Smoking history reviewed.   Objective:  Physical Exam: BP 132/65   Pulse 64   Temp 98.4 F (36.9 C) (Oral)   Ht 5\' 1"  (1.549 m)   Wt 177 lb (80.3 kg)   SpO2 95%   BMI 33.44 kg/m   Gen: NAD, resting comfortably HEENT: TMs retracted bilaterally without other obvious deformity.  MSK: - Hands: Arthritic changes noted in PIP joints bilaterally. Limited active range of motion. Grip strength 5/5. Finger-thumb opposition 5/5. Thumb extension 5/5. Tinel's test negative. Phalen positive.  Skin: warm, dry Neuro: grossly normal, moves all extremities Psych: Normal affect and thought content  Assessment/Plan:  Osteoarthritis Continue mobic. Will start voltaren gel. Encouraged use of wrist splints at least at night for her carpal tunnel syndrome. Recommended close follow up with orthopedics if carpal tunnel symptoms worsen.   Sjogren's syndrome Ear fullness likely due to bilateral TM retraction secondary to eustachian tube dysfunction secondary to sjogren's syndrome. Recommended follow up with ENT.    Katina Degreealeb M. Jimmey RalphParker, MD [facility] Resident  PGY-3 05/26/2017 10:40 AM

## 2017-05-26 NOTE — Assessment & Plan Note (Signed)
Ear fullness likely due to bilateral TM retraction secondary to eustachian tube dysfunction secondary to sjogren's syndrome. Recommended follow up with ENT.

## 2017-05-26 NOTE — Assessment & Plan Note (Signed)
Continue mobic. Will start voltaren gel. Encouraged use of wrist splints at least at night for her carpal tunnel syndrome. Recommended close follow up with orthopedics if carpal tunnel symptoms worsen.

## 2017-05-30 ENCOUNTER — Telehealth: Payer: Self-pay | Admitting: Family Medicine

## 2017-05-30 NOTE — Telephone Encounter (Signed)
Pt states the wrong med was called in for her, pt was supposed to have voltaren gel not the tablet. ep

## 2017-05-30 NOTE — Telephone Encounter (Signed)
Pt has never had chicken pox, pt wants to know if she should get the chicken pox vaccine. Pt doesn't want to get shingles. I spoke with [name] she recommended pt make appointment with PCP to discuss, but pt declined. Just wanted to leave a message. ep

## 2017-05-30 NOTE — Telephone Encounter (Signed)
Will forward to Dr. Jimmey RalphParker that saw patient.  [name], [name] L, RN

## 2017-05-31 ENCOUNTER — Telehealth: Payer: Self-pay | Admitting: *Deleted

## 2017-05-31 MED ORDER — DICLOFENAC SODIUM 1 % TD GEL: 4.0000 g | Freq: Four times a day (QID) | TRANSDERMAL | 1 refills | Status: DC

## 2017-05-31 MED ORDER — ZOSTER VACCINE LIVE 19400 UNT/0.65ML ~~LOC~~ SUSR: 0.6500 mL | Freq: Once | SUBCUTANEOUS | 0 refills | Status: AC

## 2017-05-31 NOTE — Telephone Encounter (Signed)
Rx sent in.  Emma Degreealeb M. Jimmey RalphParker, MD [facility] Resident PGY-3 05/31/2017 8:58 AM

## 2017-05-31 NOTE — Telephone Encounter (Signed)
Please inform patient that she can go to her local pharmacy and get the shingles vaccine. Her insurance should cover it as preventative since she is over 60. Thanks

## 2017-05-31 NOTE — Telephone Encounter (Signed)
PA approved for Diclofenac topical 1% gel until 6/20/212 via [employer].  [name], Aram Domzalski L, RN

## 2017-05-31 NOTE — Telephone Encounter (Signed)
Will forward to Dr. Doroteo GlassmanPhelps who is patient's PCP. Jazmin Hartsell,CMA

## 2017-05-31 NOTE — Telephone Encounter (Signed)
Prior Authorization received from [facility] pharmacy for diclofenac 1% gel. PA completed online at www.covermymeds.com. PA is pending per [facility].   [name], Zully Frane L, RN

## 2017-05-31 NOTE — Telephone Encounter (Signed)
Please send this script to the pharmacy.  Malachi Suderman,CMA

## 2017-06-05 DIAGNOSIS — H6523 Chronic serous otitis media, bilateral: Secondary | ICD-10-CM | POA: Insufficient documentation

## 2017-06-05 DIAGNOSIS — H6983 Other specified disorders of Eustachian tube, bilateral: Secondary | ICD-10-CM | POA: Insufficient documentation

## 2017-06-09 ENCOUNTER — Other Ambulatory Visit: Payer: Self-pay | Admitting: Obstetrics and Gynecology

## 2017-06-09 DIAGNOSIS — M25532 Pain in left wrist: Secondary | ICD-10-CM

## 2017-06-12 ENCOUNTER — Other Ambulatory Visit: Payer: Self-pay | Admitting: *Deleted

## 2017-06-12 MED ORDER — DICLOFENAC SODIUM 1 % TD GEL: 4.0000 g | Freq: Four times a day (QID) | TRANSDERMAL | 1 refills | Status: AC

## 2017-06-21 ENCOUNTER — Other Ambulatory Visit: Payer: Self-pay | Admitting: Obstetrics and Gynecology

## 2017-06-21 DIAGNOSIS — M25532 Pain in left wrist: Secondary | ICD-10-CM

## 2017-07-21 ENCOUNTER — Other Ambulatory Visit: Payer: Self-pay | Admitting: Family Medicine

## 2017-07-27 ENCOUNTER — Encounter: Payer: Self-pay | Admitting: Family Medicine

## 2017-07-28 ENCOUNTER — Other Ambulatory Visit: Payer: Self-pay | Admitting: Family Medicine

## 2017-07-28 DIAGNOSIS — M1990 Unspecified osteoarthritis, unspecified site: Secondary | ICD-10-CM

## 2017-07-28 MED ORDER — DICLOFENAC SODIUM 25 MG PO TBEC: 75.0000 mg | DELAYED_RELEASE_TABLET | Freq: Two times a day (BID) | ORAL | Status: DC

## 2017-07-28 NOTE — Progress Notes (Signed)
Refilled patient's Diclofenac received from outside provider.  States Diclofenac works better for her than Meloxicam.  Ellwood Dense, DO PGY-1, [facility] 07/28/2017 11:23 PM

## 2017-08-08 ENCOUNTER — Other Ambulatory Visit: Payer: Self-pay | Admitting: Family Medicine

## 2017-08-08 NOTE — Telephone Encounter (Signed)
Patient states that pharmacy still didn't received script.  Per Epic refill was ordered as a medication to administer in the office and not as a prescription being sent to the pharmacy.  Prescription has been resent to pharmacy. Merik Mignano,CMA

## 2017-08-19 ENCOUNTER — Other Ambulatory Visit: Payer: Self-pay | Admitting: Obstetrics and Gynecology

## 2017-09-02 ENCOUNTER — Other Ambulatory Visit: Payer: Self-pay | Admitting: Obstetrics and Gynecology

## 2017-09-02 DIAGNOSIS — E785 Hyperlipidemia, unspecified: Secondary | ICD-10-CM

## 2017-09-11 NOTE — Progress Notes (Signed)
Subjective:   Patient ID: Emma Curtis    DOB: 1956-07-11, 61 y.o. female   MRN: [id number]  Emma Curtis is a 61 y.o. female with a history of HTN, Sleep apnea, OA here for follow up on medications and discuss applying for disability.  Hypertension: - Medications: HCTZ, Cozaar - Compliance: yes - Checking BP at home: yes but not recently BP cuff is broken - Denies any SOB, CP, or symptoms of hypotension. Endorsing hair loss, vision changes, some LE edema but has h/o venous stasis. Sees ENT - Diet: doesn't eat a lot of fast food, goes to cafeterias a lot ([employer]), likes vegetables. - Exercise: hasn't been doing a lot due to OA pain/swelling  Osteoarthritis - Medications: Diclofenac, takes once per day - been having a lot of pain and swelling - mainly hands - saw OT after carpal surgery in 2004-05 but not since. - gets massages every 2 weeks and helps to ease pain in lower back and neck. - has seen a chiropractor before (Dr. Allison Quarry) but was getting too expensive so hasn't been seen there in years. 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 Interested in applying for Disability.  Sleep Apnea/Allergies/Sjogren's syndrome - uses CPAP - Per ENT: (middle ear effusions) fluticasone nightly, saline nasal spray, daily antihistamines - patient with good compliance - Also states vision and hearing loss from Sjogren's syndrome impede her job - Due for reevaluation at ENT   Health Maintenance - Flu shot - declined - mammogram, last one 2008 - colonoscopy, last one 2010,  Every 5 years - A1c, last check 2014  Review of Systems:  Per HPI.   PMFSH: reviewed. Smoking status reviewed. Medications reviewed.  Objective:   BP 102/60   Pulse 90   Temp 98.4 F (36.9 C) (Oral)   Wt 179 lb (81.2 kg)   SpO2 95%   BMI 33.82 kg/m  Vitals and nursing note reviewed.  General: well nourished, well developed, in no acute distress with non-toxic  appearance HEENT: normocephalic, atraumatic, moist mucous membranes, swollen submandibular glands Neck: supple, non-tender without lymphadenopathy CV: regular rate and rhythm without murmurs, rubs, or gallops Lungs: clear to auscultation bilaterally with normal work of breathing Skin: warm, dry, no rashes or lesions Extremities: warm and well perfused, normal tone MSK: Slightly decreased ROM of fingers, 5/5 strength of UE, Decreased grip strength, gait normal Neuro: Alert and oriented, speech normal  Assessment & Plan:   HYPERTENSION, BENIGN SYSTEMIC Well controlled on HCTZ , Cozaar . 102/60 today. Denies SOB, CP, or symptoms of hypotension. Endorses recent hair loss and attributes this to BP meds although has been on them for a while. Also informed patient this is not listed as a side effect.  - Counseled on diet and exercise - Continue current management. - CMP to assess kidney and liver fxn.   Osteoarthritis Chronic pain and swelling. Takes Diclofenac  once daily, although prescribed BID. H/o carpal tunnel syndrome with surgery in 2004-05 and saw OT post-op.  - instructed to take Diclofenac as prescribed - referral to OT for increased mobility, ROM, aid with Disability process if needed. - continue massages as they help ease pain.  Health care maintenance Overdue for colonoscopy and screening mammogram. - ordered mammogram, A1c - provided patient with information to schedule these appointments.   Orders Placed This Encounter  Procedures  . MM DIGITAL SCREENING BILATERAL    Standing Status:   Future    Standing Expiration Date:  11/12/2018    Order Specific Question:   Reason for Exam (SYMPTOM  OR DIAGNOSIS REQUIRED)    Answer:   screening    Order Specific Question:   Preferred imaging location?    Answer:   [facility]  . Comprehensive metabolic panel  . OT eval and treat    Standing Status:   Future    Standing Expiration Date:   09/12/2018  . HgB A1c    No orders of the defined types were placed in this encounter.   Ellwood Dense, DO PGY-1,  Family Medicine 09/12/2017 12:20 PM

## 2017-09-12 ENCOUNTER — Encounter: Payer: Self-pay | Admitting: Family Medicine

## 2017-09-12 ENCOUNTER — Ambulatory Visit (INDEPENDENT_AMBULATORY_CARE_PROVIDER_SITE_OTHER): Payer: BC Managed Care – PPO | Admitting: Family Medicine

## 2017-09-12 VITALS — BP 102/60 | HR 90 | Temp 98.4°F | Wt 179.0 lb

## 2017-09-12 DIAGNOSIS — I10 Essential (primary) hypertension: Secondary | ICD-10-CM | POA: Diagnosis not present

## 2017-09-12 DIAGNOSIS — M19041 Primary osteoarthritis, right hand: Secondary | ICD-10-CM

## 2017-09-12 DIAGNOSIS — R7309 Other abnormal glucose: Secondary | ICD-10-CM

## 2017-09-12 DIAGNOSIS — M19042 Primary osteoarthritis, left hand: Secondary | ICD-10-CM | POA: Diagnosis not present

## 2017-09-12 DIAGNOSIS — Z0000 Encounter for general adult medical examination without abnormal findings: Secondary | ICD-10-CM | POA: Diagnosis not present

## 2017-09-12 LAB — POCT GLYCOSYLATED HEMOGLOBIN (HGB A1C): HEMOGLOBIN A1C: 5.9

## 2017-09-12 NOTE — Assessment & Plan Note (Addendum)
Well controlled on HCTZ , Cozaar . 102/60 today. Denies SOB, CP, or symptoms of hypotension. Endorses recent hair loss and attributes this to BP meds although has been on them for a while. Also informed patient this is not listed as a side effect.  - Counseled on diet and exercise - Continue current management. - CMP to assess kidney and liver fxn.

## 2017-09-12 NOTE — Assessment & Plan Note (Addendum)
Overdue for colonoscopy and screening mammogram. - ordered mammogram, A1c - provided patient with information to schedule these appointments.

## 2017-09-12 NOTE — Patient Instructions (Signed)
It was great to see you!  For your hand pain,  - Take your diclofenac twice daily to help with the pain. - We are referring you to Occupational Therapy to help increase mobility and decrease pain. They can also help you start the disability process if needed.  For your blood pressure, - It is well controlled today. - Remember to check your BP at home and keep record of your readings. - We are keeping you on the same medication.  Be sure to schedule your mammogram and colonoscopy to ensure good health maintenance and screen for breast and colon cancer.  We are checking some labs today, we will call you or send you a letter if they are abnormal.   Take care and seek immediate care sooner if you develop any concerns.   Ellwood Dense, DO [facility]   Joint Pain Joint pain can be caused by many things. The joint can be bruised, infected, weak from aging, or sore from exercise. The pain will probably go away if you follow your doctor's instructions for home care. If your joint pain continues, more tests may be needed to help find the cause of your condition. Follow these instructions at home: Watch your condition for any changes. Follow these instructions as told to lessen the pain that you are feeling:  Take medicines only as told by your doctor.  Rest the sore joint for as long as told by your doctor. If your doctor tells you to, raise (elevate) the painful joint above the level of your heart while you are sitting or lying down.  Do not do things that cause pain or make the pain worse.  If told, put ice on the painful area: ? Put ice in a plastic bag. ? Place a towel between your skin and the bag. ? Leave the ice on for 20 minutes, 2-3 times per day.  Wear an elastic bandage, splint, or sling as told by your doctor. Loosen the bandage or splint if your fingers or toes lose feeling (become numb) and tingle, or if they turn cold and blue.  Begin exercising or stretching  the joint as told by your doctor. Ask your doctor what types of exercise are safe for you.  Keep all follow-up visits as told by your doctor. This is important.  Contact a doctor if:  Your pain gets worse and medicine does not help it.  Your joint pain does not get better in 3 days.  You have more bruising or swelling.  You have a fever.  You lose 10 pounds (4.5 kg) or more without trying. Get help right away if:  You are not able to move the joint.  Your fingers or toes become numb or they turn cold and blue. This information is not intended to replace advice given to you by your health care provider. Make sure you discuss any questions you have with your health care provider. Document Released: 11/16/2009 Document Revised: 05/05/2016 Document Reviewed: 09/09/2014 Elsevier Interactive Patient Education  [employer].

## 2017-09-12 NOTE — Assessment & Plan Note (Signed)
Chronic pain and swelling. Takes Diclofenac  once daily, although prescribed BID. H/o carpal tunnel syndrome with surgery in 2004-05 and saw OT post-op.  - instructed to take Diclofenac as prescribed - referral to OT for increased mobility, ROM, aid with Disability process if needed. - continue massages as they help ease pain.

## 2017-09-13 LAB — COMPREHENSIVE METABOLIC PANEL
ALK PHOS: 118 IU/L — AB (ref 39–117)
ALT: 29 IU/L (ref 0–32)
AST: 27 IU/L (ref 0–40)
Albumin/Globulin Ratio: 1.5 (ref 1.2–2.2)
Albumin: 4.4 g/dL (ref 3.6–4.8)
BILIRUBIN TOTAL: 1 mg/dL (ref 0.0–1.2)
BUN/Creatinine Ratio: 18 (ref 12–28)
BUN: 16 mg/dL (ref 8–27)
CHLORIDE: 100 mmol/L (ref 96–106)
CO2: 30 mmol/L — ABNORMAL HIGH (ref 20–29)
Calcium: 9.8 mg/dL (ref 8.7–10.3)
Creatinine, Ser: 0.87 mg/dL (ref 0.57–1.00)
GFR calc Af Amer: 83 mL/min/{1.73_m2} (ref >59)
GFR calc non Af Amer: 72 mL/min/{1.73_m2} (ref >59)
GLOBULIN, TOTAL: 2.9 g/dL (ref 1.5–4.5)
GLUCOSE: 104 mg/dL — AB (ref 65–99)
Potassium: 3.8 mmol/L (ref 3.5–5.2)
SODIUM: 141 mmol/L (ref 134–144)
Total Protein: 7.3 g/dL (ref 6.0–8.5)

## 2017-09-18 ENCOUNTER — Other Ambulatory Visit: Payer: Self-pay | Admitting: Family Medicine

## 2017-09-18 ENCOUNTER — Encounter: Payer: Self-pay | Admitting: Family Medicine

## 2017-09-18 DIAGNOSIS — M1990 Unspecified osteoarthritis, unspecified site: Secondary | ICD-10-CM

## 2017-09-18 MED ORDER — DICLOFENAC SODIUM 75 MG PO TBEC: 75.0000 mg | DELAYED_RELEASE_TABLET | Freq: Two times a day (BID) | ORAL | 2 refills | Status: AC

## 2017-10-02 ENCOUNTER — Ambulatory Visit
Admission: RE | Admit: 2017-10-02 | Discharge: 2017-10-02 | Disposition: A | Discharge status: Home / Self Care | Payer: BC Managed Care – PPO | Source: Ambulatory Visit | Attending: Family Medicine | Admitting: Family Medicine

## 2017-10-02 DIAGNOSIS — Z0000 Encounter for general adult medical examination without abnormal findings: Secondary | ICD-10-CM

## 2017-10-13 ENCOUNTER — Ambulatory Visit: Payer: BC Managed Care – PPO | Admitting: Family Medicine

## 2017-11-17 ENCOUNTER — Other Ambulatory Visit: Payer: Self-pay | Admitting: Family Medicine

## 2017-11-17 MED ORDER — LOSARTAN POTASSIUM 50 MG PO TABS: 50.0000 mg | ORAL_TABLET | Freq: Every day | ORAL | 2 refills | Status: DC

## 2017-11-23 ENCOUNTER — Emergency Department (HOSPITAL_BASED_OUTPATIENT_CLINIC_OR_DEPARTMENT_OTHER): Payer: BC Managed Care – PPO

## 2017-11-23 ENCOUNTER — Encounter (HOSPITAL_BASED_OUTPATIENT_CLINIC_OR_DEPARTMENT_OTHER): Payer: Self-pay | Admitting: *Deleted

## 2017-11-23 ENCOUNTER — Emergency Department (HOSPITAL_BASED_OUTPATIENT_CLINIC_OR_DEPARTMENT_OTHER)
Admission: EM | Admit: 2017-11-23 | Discharge: 2017-11-23 | Disposition: A | Discharge status: Home / Self Care | Payer: BC Managed Care – PPO | Attending: Emergency Medicine | Admitting: Emergency Medicine

## 2017-11-23 ENCOUNTER — Other Ambulatory Visit: Payer: Self-pay

## 2017-11-23 DIAGNOSIS — Z79899 Other long term (current) drug therapy: Secondary | ICD-10-CM | POA: Diagnosis not present

## 2017-11-23 DIAGNOSIS — R0789 Other chest pain: Secondary | ICD-10-CM | POA: Diagnosis not present

## 2017-11-23 DIAGNOSIS — R079 Chest pain, unspecified: Secondary | ICD-10-CM | POA: Diagnosis present

## 2017-11-23 DIAGNOSIS — IMO0001 Reserved for inherently not codable concepts without codable children: Secondary | ICD-10-CM

## 2017-11-23 DIAGNOSIS — E041 Nontoxic single thyroid nodule: Secondary | ICD-10-CM

## 2017-11-23 DIAGNOSIS — R911 Solitary pulmonary nodule: Secondary | ICD-10-CM | POA: Insufficient documentation

## 2017-11-23 DIAGNOSIS — I10 Essential (primary) hypertension: Secondary | ICD-10-CM | POA: Diagnosis not present

## 2017-11-23 DIAGNOSIS — Z87891 Personal history of nicotine dependence: Secondary | ICD-10-CM | POA: Diagnosis not present

## 2017-11-23 LAB — TROPONIN I

## 2017-11-23 LAB — CBC WITH DIFFERENTIAL/PLATELET
BASOS ABS: 0 10*3/uL (ref 0.0–0.1)
Basophils Relative: 0 %
Eosinophils Absolute: 0.3 10*3/uL (ref 0.0–0.7)
Eosinophils Relative: 6 %
HEMATOCRIT: 40.1 % (ref 36.0–46.0)
HEMOGLOBIN: 13.2 g/dL (ref 12.0–15.0)
LYMPHS PCT: 34 %
Lymphs Abs: 1.9 10*3/uL (ref 0.7–4.0)
MCH: 28.9 pg (ref 26.0–34.0)
MCHC: 32.9 g/dL (ref 30.0–36.0)
MCV: 87.9 fL (ref 78.0–100.0)
MONO ABS: 0.4 10*3/uL (ref 0.1–1.0)
Monocytes Relative: 8 %
NEUTROS ABS: 2.9 10*3/uL (ref 1.7–7.7)
NEUTROS PCT: 52 %
Platelets: 274 10*3/uL (ref 150–400)
RBC: 4.56 MIL/uL (ref 3.87–5.11)
RDW: 13.1 % (ref 11.5–15.5)
WBC: 5.5 10*3/uL (ref 4.0–10.5)

## 2017-11-23 LAB — BASIC METABOLIC PANEL
ANION GAP: 8 (ref 5–15)
BUN: 18 mg/dL (ref 6–20)
CO2: 27 mmol/L (ref 22–32)
Calcium: 9.4 mg/dL (ref 8.9–10.3)
Chloride: 102 mmol/L (ref 101–111)
Creatinine, Ser: 0.74 mg/dL (ref 0.44–1.00)
GLUCOSE: 99 mg/dL (ref 65–99)
POTASSIUM: 3.3 mmol/L — AB (ref 3.5–5.1)
Sodium: 137 mmol/L (ref 135–145)

## 2017-11-23 LAB — D-DIMER, QUANTITATIVE: D-Dimer, Quant: 1.94 ug/mL-FEU — ABNORMAL HIGH (ref 0.00–0.50)

## 2017-11-23 MED ORDER — POTASSIUM CHLORIDE CRYS ER 20 MEQ PO TBCR
40.0000 meq | EXTENDED_RELEASE_TABLET | Freq: Once | ORAL | Status: AC
  Administered 2017-11-23: 40 meq via ORAL
  Filled 2017-11-23: qty 2

## 2017-11-23 MED ORDER — IOPAMIDOL (ISOVUE-370) INJECTION 76%
100.0000 mL | Freq: Once | INTRAVENOUS | Status: AC | PRN
  Administered 2017-11-23: 100 mL via INTRAVENOUS

## 2017-11-23 MED ORDER — ASPIRIN 81 MG PO CHEW
324.0000 mg | CHEWABLE_TABLET | Freq: Once | ORAL | Status: AC
  Administered 2017-11-23: 324 mg via ORAL
  Filled 2017-11-23: qty 4

## 2017-11-23 MED ORDER — NITROGLYCERIN 0.4 MG SL SUBL: 0.4000 mg | SUBLINGUAL_TABLET | SUBLINGUAL | Status: DC | PRN

## 2017-11-23 NOTE — ED Provider Notes (Signed)
[facility] EMERGENCY DEPARTMENT Provider Note   CSN: [id number] Arrival date & time: 11/23/17  1512     History   Chief Complaint Chief Complaint  Patient presents with  . Chest Pain    HPI Emma Curtis is a 61 y.o. female.  HPI  61 year old female presents with chest pain.  Started today at around 10 or 11 AM.  It is brief, lasting only a few seconds and certainly less than a minute at a time.  At first it was happening every hour or so but now is becoming more frequent and she estimates it happens every 30 minutes or so.  When it occurs it is a dull pain in the middle and left of her chest.  It does not radiate.  The pain takes her breath away briefly and she feels like she has to hold her breath.  Estimates its about a 5 out of 10 on the pain scale.  A couple time she broke out in a sweat but other times is not.  No nausea or vomiting.  She has chronic left lower extremity swelling from a prior infection occurring over 1 year ago but denies any new or different swelling.  Nothing she does makes the pain better or worse.  Has not noticed any pain with exertion.  Has history of hypertension and hyperlipidemia.  She denies diabetes and has not smoked in over 20 years.  No family history of coronary disease.  Past Medical History:  Diagnosis Date  . AOM (acute otitis media)    hx  of s/p tube right ear  . Arthritis    "hands" (11/26/2013)  . Cellulitis 11/2013   LLE  . Complication of anesthesia    "couldn't wake up after my first c-section" (11/26/2013)  . DVT (deep venous thrombosis) (HCC) 1989   immediately postpartum   . GERD (gastroesophageal reflux disease)   . HTN (hypertension)   . Hypertension   . Obstructive sleep apnea 01/09/03   ahi 79/hr  . OSA on CPAP   . Sjogren's syndrome (HCC)    cervical node  bx dr shoemaker    Patient Active Problem List   Diagnosis Date Noted  . Wrist pain, left 08/01/2016  . Health care maintenance 03/08/2016  .  Seasonal allergic rhinitis 02/19/2016  . Xerosis of skin 08/06/2014  . Venous stasis 01/10/2014  . Esophageal reflux 12/25/2013  . Hyperglycemia 11/26/2013  . Sjogren's syndrome (HCC) 05/14/2010  . Osteoarthritis 01/13/2009  . Hyperlipemia 02/26/2007  . HYPERTENSION, BENIGN SYSTEMIC 02/08/2007  . MENOPAUSAL SYNDROME 02/08/2007  . Sleep apnea 02/08/2007    Past Surgical History:  Procedure Laterality Date  . ABDOMINAL HYSTERECTOMY  1996   "started laparoscopically; had to open me up; repair bladder; complete hysterectomy; left an ovary" (11/26/2013)  . BLADDER SURGERY  1996   "bladder ruptured during hysterectomy OR; repaired; wore ostomy for ~ 3 months" (11/26/2013)  . CARPAL TUNNEL RELEASE Bilateral 03/2003-05/2003  . CARPAL TUNNEL RELEASE Right 11/2003   "adjusted the 1st carpal tunnel release" (11/26/2013)  . CERVICAL BIOPSY     cervical node biopsy  . CESAREAN SECTION  1985; 1989  . DILATION AND CURETTAGE OF UTERUS    . FOOT SURGERY Left 2000's   "fat lodged between bone and tendon" (11/26/2013)  . LAPAROSCOPIC HYSTERECTOMY  12/12/94   bladder rupture complication  . MYRINGOTOMY WITH TUBE PLACEMENT Bilateral 07/2006  . OSTOMY TAKE DOWN  1997   "bladder ostomy" (11/26/2013)  . SALIVARY GLAND SURGERY  Right 07/2006   biopsy/notes 08/02/2006 (11/26/2013)  . TUBAL LIGATION  1990    OB History    No data available       Home Medications    Prior to Admission medications   Medication Sig Start Date End Date Taking? Authorizing Provider  atorvastatin (LIPITOR) 40 MG tablet take 1 tablet by mouth once daily AT 6PM 09/05/17   Ellwood Denseumball, Alison, DO  diclofenac (VOLTAREN) 75 MG EC tablet Take 1 tablet (75 mg total) by mouth 2 (two) times daily. 09/18/17 12/17/17  Ellwood Denseumball, Alison, DO  diclofenac sodium (VOLTAREN) 1 % GEL Apply 4 g topically 4 (four) times daily. 06/12/17   Ellwood Denseumball, Alison, DO  fluticasone (FLONASE) 50 MCG/ACT nasal spray Place 1 spray into both nostrils daily. 06/17/16    Pincus LargePhelps, Jazma Y, DO  hydrochlorothiazide (HYDRODIURIL) 25 MG tablet take 1 tablet by mouth once daily 08/21/17   Ellwood Denseumball, Alison, DO  losartan (COZAAR) 50 MG tablet Take 1 tablet (50 mg total) by mouth daily. 11/17/17   Ellwood Denseumball, Alison, DO  omeprazole (PRILOSEC) 40 MG capsule take 1 capsule by mouth once daily Patient not taking: Reported on 06/17/2016 09/05/14   Leona Singletonhekkekandam, Maria T, MD  senna (SENOKOT) 8.6 MG TABS tablet Take 1 tablet (8.6 mg total) by mouth daily as needed for mild constipation. Patient not taking: Reported on 06/17/2016 05/19/14   Leona Singletonhekkekandam, Maria T, MD  sodium chloride (OCEAN) 0.65 % SOLN nasal spray Place 1 spray into both nostrils as needed for congestion. 03/08/16   Mayo, Allyn KennerKaty Dodd, MD    Family History Family History  Problem Relation Age of Onset  . Heart defect Brother        died age 10214 heart/valve dz  . Melanoma Sister        died age 61 melanoma/sarcoma  . Cancer Maternal Aunt        dm and cancer  . Dementia Father        died age 61  . Cancer Maternal Grandfather        colon ca died  . Dementia Paternal Grandmother        died dementia  . Breast cancer Neg Hx     Social History Social History   Tobacco Use  . Smoking status: Former Smoker    Packs/day: 1.00    Years: 23.00    Pack years: 23.00    Types: Cigarettes    Last attempt to quit: 10/12/1996    Years since quitting: 21.1  . Smokeless tobacco: Never Used  Substance Use Topics  . Alcohol use: Yes    Comment: 11/26/2013 "2 shots vodka or rum/wk; none since 1984"  . Drug use: No     Allergies   Lisinopril   Review of Systems Review of Systems  Constitutional: Positive for diaphoresis.  Respiratory: Positive for shortness of breath.   Cardiovascular: Positive for chest pain and leg swelling (chronic, LLE).  Gastrointestinal: Negative for nausea and vomiting.  All other systems reviewed and are negative.    Physical Exam Updated Vital Signs BP 106/69   Pulse 76   Temp 98.1  F (36.7 C) (Oral)   Resp 16   Ht 5\' 1"  (1.549 m)   Wt 81.2 kg (179 lb)   SpO2 97%   BMI 33.82 kg/m   Physical Exam  Constitutional: She is oriented to person, place, and time. She appears well-developed and well-nourished.  Non-toxic appearance. She does not appear ill. No distress.  HENT:  Head: Normocephalic  and atraumatic.  Right Ear: External ear normal.  Left Ear: External ear normal.  Nose: Nose normal.  Eyes: Right eye exhibits no discharge. Left eye exhibits no discharge.  Cardiovascular: Normal rate, regular rhythm and normal heart sounds.  Pulmonary/Chest: Effort normal and breath sounds normal. She exhibits no tenderness.  Abdominal: Soft. There is no tenderness.  Neurological: She is alert and oriented to person, place, and time.  Skin: Skin is warm and dry.  Nursing note and vitals reviewed.    ED Treatments / Results  Labs (all labs ordered are listed, but only abnormal results are displayed) Labs Reviewed  BASIC METABOLIC PANEL - Abnormal; Notable for the following components:      Result Value   Potassium 3.3 (*)    All other components within normal limits  D-DIMER, QUANTITATIVE (NOT AT [facility]) - Abnormal; Notable for the following components:   D-Dimer, Quant 1.94 (*)    All other components within normal limits  CBC WITH DIFFERENTIAL/PLATELET  TROPONIN I  TROPONIN I    EKG  EKG Interpretation  Date/Time:  Thursday November 23 2017 15:36:29 EST Ventricular Rate:  79 PR Interval:    QRS Duration: 100 QT Interval:  403 QTC Calculation: 462 R Axis:   9 Text Interpretation:  Sinus rhythm Baseline wander in lead(s) V2 no acute ST/T changes no significant change since 2013 Confirmed by Pricilla LovelessGoldston, Tiamarie Furnari [phone]([id number]) on 11/23/2017 3:39:55 PM       EKG Interpretation  Date/Time:  Thursday November 23 2017 18:15:54 EST Ventricular Rate:  80 PR Interval:    QRS Duration: 104 QT Interval:  410 QTC Calculation: 473 R Axis:   6 Text Interpretation:  Sinus  rhythm no acute ST/T changes no significant change since earlier in the day Confirmed by Pricilla LovelessGoldston, Yazen Rosko [phone]([id number]) on 11/23/2017 7:29:49 PM        Radiology Dg Chest 2 View  Result Date: 11/23/2017 CLINICAL DATA:  Chest pain and shortness of breath EXAM: CHEST  2 VIEW COMPARISON:  August 01, 2006 FINDINGS: There is no edema or consolidation. The heart size and pulmonary vascularity are normal. No adenopathy. No bone lesions. No pneumothorax. IMPRESSION: No edema or consolidation. Electronically Signed   By: Bretta BangWilliam  Woodruff III M.D.   On: 11/23/2017 16:09   Ct Angio Chest Pe W/cm &/or Wo Cm  Result Date: 11/23/2017 CLINICAL DATA:  Shortness of breath with sternal chest pain and left leg tingling. EXAM: CT ANGIOGRAPHY CHEST WITH CONTRAST TECHNIQUE: Multidetector CT imaging of the chest was performed using the standard protocol during bolus administration of intravenous contrast. Multiplanar CT image reconstructions and MIPs were obtained to evaluate the vascular anatomy. CONTRAST:  100mL ISOVUE-370 IOPAMIDOL (ISOVUE-370) INJECTION 76% COMPARISON:  74 cc Isovue 370 FINDINGS: Cardiovascular: The heart size is normal. No pericardial effusion. No thoracic aortic aneurysm. No filling defect in the opacified pulmonary arteries to suggest the presence of an acute pulmonary embolus. Mediastinum/Nodes: No mediastinal lymphadenopathy. There is no hilar lymphadenopathy. The esophagus has normal imaging features. 2.9 cm irregular right thyroid nodule is associated with a 1.4 cm nodule in the left thyroid lobe. Lungs/Pleura: No focal airspace consolidation. No pulmonary edema or pleural effusion. Scattered tiny bilateral pulmonary nodules are identified ranging in size from 2 mm to 4 mm (4 mm right middle lobe anteriorly image 55 series 5 and 4 mm central right lower lobe nodule on image 60 series 5). Upper Abdomen: 6 mm hypoattenuating lesion in the central liver is too small to characterize but is  likely benign and  probably a cyst. Otherwise unremarkable. Musculoskeletal: Bone windows reveal no worrisome lytic or sclerotic osseous lesions. Review of the MIP images confirms the above findings. IMPRESSION: 1. No CT evidence for acute pulmonary embolus. 2. Scattered bilateral pulmonary nodules measuring up to 4 mm diameter. No follow-up needed if patient is low-risk (and has no known or suspected primary neoplasm). Non-contrast chest CT can be considered in 12 months if patient is high-risk. This recommendation follows the consensus statement: Guidelines for Management of Incidental Pulmonary Nodules Detected on CT Images: From the Fleischner Society 2017; Radiology 2017; 284:228-243. 3. 2.9 cm right thyroid nodule. Thyroid ultrasound recommended to further evaluate. This follows ACR consensus guidelines: Managing Incidental Thyroid Nodules Detected on Imaging: White Paper of the ACR Incidental Thyroid Findings Committee. J Am Coll Radiol 2015; 12:143-150. Electronically Signed   By: Kennith Center M.D.   On: 11/23/2017 17:32    Procedures Procedures (including critical care time)  Medications Ordered in ED Medications  nitroGLYCERIN (NITROSTAT) SL tablet 0.4 mg (not administered)  aspirin chewable tablet 324 mg (324 mg Oral Given 11/23/17 1648)  iopamidol (ISOVUE-370) 76 % injection 100 mL (100 mLs Intravenous Contrast Given 11/23/17 1707)  potassium chloride SA (K-DUR,KLOR-CON) CR tablet 40 mEq (40 mEq Oral Given 11/23/17 1640)     Initial Impression / Assessment and Plan / ED Course  I have reviewed the triage vital signs and the nursing notes.  Pertinent labs & imaging results that were available during my care of the patient were reviewed by me and considered in my medical decision making (see chart for details).     Patient is feeling well.  Her workup is unremarkable besides elevated d-dimer which led to a CT scan.  This shows no acute pathology including no PE.  She was made aware of the thyroid  nodule and lung nodules.  Given remote smoking history she will need follow-up.  Has 2- troponins and unremarkable ECG that is not changing.  I think ACS is unlikely.  While she does have some risk factors I think this can be worked up as an outpatient given the atypical nature.  However have discussed that while there is no MI now she could still have coronary disease and follow-up is important.  Discussed strict return precautions.  Final Clinical Impressions(s) / ED Diagnoses   Final diagnoses:  Atypical chest pain  Lung nodule < 6cm on CT  Thyroid nodule    ED Discharge Orders    None       Pricilla Loveless, MD 11/23/17 1942

## 2017-11-23 NOTE — Discharge Instructions (Signed)
Follow-up with your doctor for further evaluation of your chest pain.  Also you need to follow-up with your doctor for small lung nodules/spots seen on your chest CT scan.  He also have a spot/nodule on her thyroid.

## 2017-11-23 NOTE — ED Triage Notes (Signed)
While at work she experienced chest pressure and SOB. Chest pain this am before going to work.

## 2017-11-23 NOTE — ED Notes (Signed)
Patient transported to CT 

## 2017-12-17 DIAGNOSIS — E669 Obesity, unspecified: Secondary | ICD-10-CM | POA: Insufficient documentation

## 2017-12-17 NOTE — Progress Notes (Signed)
Subjective:   Patient ID: Emma Curtis    DOB: 1956-09-25, 62 y.o. female   MRN: [id number]  Emma Curtis is a 62 y.o. female with a history of HTN, HLD, OA, Sjogren's here for    ED follow up - chest pain - 12/13, brief dull pain in middle of chest, atypical chest pain. HEART score 3.   - workup unremarkable except for elevated D dimer but no acute findings on CT but found incidental thyroid and lung nodules  - 2.9 cm irregular R thyroid nodule, 1.4cm L thyroid nodule, scattered tiny bilateral pulmonary nodules 2-47mm.   - Based on ACR guidelines, noncontrast CT can be considered in 12 months if high risk, otherwise no follow up needed. May can consider thyroid ultrasound to further evaluate. - Has history of hypertension and hyperlipidemia.  No diabetes and has not smoked in over 20 years.  No family history of coronary disease. - Recommended outpatient follow up. No prior stress test or cath.  Headaches - Dull ache that starts in frontal area and radiates around to the back of her neck. Associated with nausea and intermittently with light sensitivity. Started a few weeks before Christmas. Is not every day. Sometimes wakes up with headaches. - No recent changes in medications. Checking BP at home: 150s/90-100 - wants referral to Dr. Axel Filler at [facility] in [facility]  - Has tried aleve 1-2 pills q12h. Tylenol makes her sick so she didn't take much of it. - Also tried a massage and drank a lot of water which helped. Has not tried a heating pad. - Mother and daughter have migraines but she has never had migraines before. - Current stressors: Denies overt stressors. 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  Review of Systems:  Per HPI.   PMFSH: reviewed. Smoking status reviewed. Medications reviewed.  Objective:   BP 138/78   Pulse 87   Temp 98.2 F (36.8 C) (Oral)   Wt 180 lb (81.6 kg)   SpO2 96%   BMI 34.01 kg/m   Vitals and nursing note reviewed.  General: well nourished, well developed, in no acute distress with non-toxic appearance HEENT: normocephalic, atraumatic, moist mucous membranes. PERRL. Neck: supple, tender to palpation along back of neck to upper trapezius bilaterally. No lymphadenopathy. Swollen submandibular glands. Slightly enlarged symmetrical thyroid. CV: regular rate and rhythm without murmurs, rubs, or gallops. Lungs: clear to auscultation bilaterally with normal work of breathing Skin: warm, dry, no rashes or lesions Extremities: warm and well perfused, normal tone MSK: Full ROM, strength 5/5 to U/LE bilaterally, normal gait.   Neuro: Alert and oriented, speech normal. PERRL, Extraocular movements intact. Intact symmetric sensation to light touch of face bilaterally.  Hearing grossly intact bilaterally.  Tongue protrudes normally with no deviation.  Shoulder shrug, smile symmetric. Optic discs poorly visualized.  Assessment & Plan:   Thyroid nodule Incidentally visualized on CT chest during ED visit. No difficulties breathing or swallowing. Slightly enlarged symmetrical thyroid on exam. ACR guidelines recommended ultrasound follow up with biopsy as needed. - Thyroid ultrasound  Tension-type headache, not intractable Tension-type evidenced by location (frontal with radiation to back of neck) associated with trapezius and neck muscular hypertonicity and tenderness. 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 Neuro exam within normal limits. Patient requests pain specialist referral for other chronic pain due to Sjogren's syndrome. - Flexeril 5mg  TID PRN [id number] - Pain Clinic referral per patient request -  continue to use Ibuprofen as needed for pain - use heating pad and continue muscle massage for pain relief.  Orders Placed This Encounter  Procedures  . US Soft Tissue Head/Neck    Mar/Paige.    Standing Status:   Future    Standing Expiration  Date:   02/16/2019    Order Specific Question:   Reason for Exam (SYMPTOM  OR DIAGNOSIS REQUIRED)    Answer:   thyroid nodule on CT    Order Specific Question:   Preferred imaging location?    Answer:   [facility]  . Ambulatory referral to Pain Clinic    Referral Priority:   Routine    Referral Type:   Consultation    Referral Reason:   Specialty Services Required    Requested Specialty:   Pain Medicine    Number of Visits Requested:   1   Meds ordered this encounter  Medications  . cyclobenzaprine (FLEXERIL) 5 MG tablet    Sig: Take 1 tablet (5 mg total) by mouth 3 (three) times daily as needed for muscle spasms.    Dispense:  30 tablet    Refill:  0    Ellwood DenseAlison Rumball, DO PGY-1, [facility] 12/18/2017 2:46 PM

## 2017-12-18 ENCOUNTER — Other Ambulatory Visit: Payer: Self-pay

## 2017-12-18 ENCOUNTER — Ambulatory Visit: Payer: BC Managed Care – PPO | Admitting: Family Medicine

## 2017-12-18 ENCOUNTER — Encounter: Payer: Self-pay | Admitting: Family Medicine

## 2017-12-18 VITALS — BP 138/78 | HR 87 | Temp 98.2°F | Wt 180.0 lb

## 2017-12-18 DIAGNOSIS — E6609 Other obesity due to excess calories: Secondary | ICD-10-CM

## 2017-12-18 DIAGNOSIS — M3500 Sicca syndrome, unspecified: Secondary | ICD-10-CM

## 2017-12-18 DIAGNOSIS — Z6833 Body mass index (BMI) 33.0-33.9, adult: Secondary | ICD-10-CM | POA: Diagnosis not present

## 2017-12-18 DIAGNOSIS — G44209 Tension-type headache, unspecified, not intractable: Secondary | ICD-10-CM | POA: Insufficient documentation

## 2017-12-18 DIAGNOSIS — E041 Nontoxic single thyroid nodule: Secondary | ICD-10-CM | POA: Diagnosis not present

## 2017-12-18 MED ORDER — CYCLOBENZAPRINE HCL 5 MG PO TABS: 5.0000 mg | ORAL_TABLET | Freq: Three times a day (TID) | ORAL | 0 refills | Status: DC | PRN

## 2017-12-18 NOTE — Assessment & Plan Note (Signed)
Tension-type evidenced by location (frontal with radiation to back of neck) associated with trapezius and neck muscular hypertonicity and tenderness. 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 Neuro exam within normal limits. Patient requests pain specialist referral for other chronic pain due to Sjogren's syndrome. - Flexeril 5mg  TID PRN [id number] - Pain Clinic referral per patient request - continue to use Ibuprofen as needed for pain - use heating pad and continue muscle massage for pain relief.

## 2017-12-18 NOTE — Patient Instructions (Signed)
It was great to see you!  For your headache,  - I placed a referral [facility] per your [facility]. - I also prescribed a muscle relaxer for you to use up to 3 times daily as needed.  For your ED follow up for chest pain, - I would like to schedule you for a Cardiology referral to evaluate your chest pain further. Because you have risk factors such as high blood pressure and high cholesterol, you are at increased risk of having a heart attack even though you do not have a family history of it.  Take care and seek immediate care sooner if you develop any concerns.   Ellwood DenseAlison Brit Wernette, DO [facility]   Tension Headache A tension headache is a feeling of pain, pressure, or aching that is often felt over the front and sides of the head. The pain can be dull, or it can feel tight (constricting). Tension headaches are not normally associated with nausea or vomiting, and they do not get worse with physical activity. Tension headaches can last from 30 minutes to several days. This is the most common type of headache. CAUSES The exact cause of this condition is not known. Tension headaches often begin after stress, anxiety, or depression. Other triggers may include:  Alcohol.  Too much caffeine, or caffeine withdrawal.  Respiratory infections, such as colds, flu, or sinus infections.  Dental problems or teeth clenching.  Fatigue.  Holding your head and neck in the same position for a long period of time, such as while using a computer.  Smoking. SYMPTOMS Symptoms of this condition include:  A feeling of pressure around the head.  Dull, aching head pain.  Pain felt over the front and sides of the head.  Tenderness in the muscles of the head, neck, and shoulders. DIAGNOSIS This condition may be diagnosed based on your symptoms and a physical exam. Tests may be done, such as a CT scan or an MRI of your head. These tests may be done if your symptoms  are severe or unusual. TREATMENT This condition may be treated with lifestyle changes and medicines to help relieve symptoms. HOME CARE INSTRUCTIONS Managing Pain  Take over-the-counter and prescription medicines only as told by your health care provider.  Lie down in a dark, quiet room when you have a headache.  If directed, apply ice to the head and neck area: ? Put ice in a plastic bag. ? Place a towel between your skin and the bag. ? Leave the ice on for 20 minutes, 2-3 times per day.  Use a heating pad or a hot shower to apply heat to the head and neck area as told by your health care provider. Eating and Drinking  Eat meals on a regular schedule.  Limit alcohol use.  Decrease your caffeine intake, or stop using caffeine. General Instructions  Keep all follow-up visits as told by your health care provider. This is important.  Keep a headache journal to help find out what may trigger your headaches. For example, write down: ? What you eat and drink. ? How much sleep you get. ? Any change to your diet or medicines.  Try massage or other relaxation techniques.  Limit stress.  Sit up straight, and avoid tensing your muscles.  Do not use tobacco products, including cigarettes, chewing tobacco, or e-cigarettes. If you need help quitting, ask your health care provider.  Exercise regularly as told by your health care provider.  Get 7-9 hours of sleep, or the amount recommended by your health care provider. SEEK MEDICAL CARE IF:  Your symptoms are not helped by medicine.  You have a headache that is different from what you normally experience.  You have nausea or you vomit.  You have a fever. SEEK IMMEDIATE MEDICAL CARE IF:  Your headache becomes severe.  You have repeated vomiting.  You have a stiff neck.  You have a loss of vision.  You have problems with speech.  You have pain in your eye or ear.  You have muscular weakness or loss of muscle  control.  You lose your balance or you have trouble walking.  You feel faint or you pass out.  You have confusion. This information is not intended to replace advice given to you by your health care provider. Make sure you discuss any questions you have with your health care provider. Document Released: 11/28/2005 Document Revised: 08/19/2015 Document Reviewed: 03/23/2015 Elsevier Interactive Patient Education  2017 [employer].

## 2017-12-18 NOTE — Assessment & Plan Note (Signed)
Incidentally visualized on CT chest during ED visit. No difficulties breathing or swallowing. Slightly enlarged symmetrical thyroid on exam. ACR guidelines recommended ultrasound follow up with biopsy as needed. - Thyroid ultrasound

## 2017-12-20 ENCOUNTER — Ambulatory Visit (HOSPITAL_COMMUNITY)
Admission: RE | Admit: 2017-12-20 | Discharge: 2017-12-20 | Disposition: A | Discharge status: Home / Self Care | Payer: BC Managed Care – PPO | Source: Ambulatory Visit | Attending: Family Medicine | Admitting: Family Medicine

## 2017-12-20 DIAGNOSIS — E041 Nontoxic single thyroid nodule: Secondary | ICD-10-CM | POA: Diagnosis present

## 2017-12-20 DIAGNOSIS — E042 Nontoxic multinodular goiter: Secondary | ICD-10-CM | POA: Insufficient documentation

## 2017-12-25 ENCOUNTER — Telehealth: Payer: Self-pay

## 2017-12-25 NOTE — Telephone Encounter (Signed)
-----   Message from Ellwood DenseAlison Rumball, DO sent at 12/21/2017 11:48 AM EST ----- Called patient to inform of US results but no answer and no ability to leave voicemail.  Nodules visualized on US were not suspicious for malignancy to warrant a biopsy. Recommended to follow up with ultrasound in 1 year.  Ellwood DenseAlison Rumball, DO PGY-1, [facility] 12/21/2017 11:47 AM

## 2017-12-25 NOTE — Telephone Encounter (Signed)
Pt contacted and VM was left regarding US results and the need for FU US in 1 year. Call back number was given if pt had any questions.

## 2018-01-23 ENCOUNTER — Encounter: Payer: Self-pay | Admitting: Family Medicine

## 2018-01-24 ENCOUNTER — Other Ambulatory Visit: Payer: Self-pay | Admitting: Family Medicine

## 2018-01-24 MED ORDER — HYDROCHLOROTHIAZIDE 25 MG PO TABS: 25.0000 mg | ORAL_TABLET | Freq: Every day | ORAL | 1 refills | Status: DC

## 2018-02-26 ENCOUNTER — Other Ambulatory Visit: Payer: Self-pay | Admitting: Family Medicine

## 2018-08-01 ENCOUNTER — Other Ambulatory Visit: Payer: Self-pay | Admitting: Family Medicine

## 2018-08-21 ENCOUNTER — Ambulatory Visit (INDEPENDENT_AMBULATORY_CARE_PROVIDER_SITE_OTHER): Payer: BC Managed Care – PPO | Admitting: Pulmonary Disease

## 2018-08-21 ENCOUNTER — Encounter: Payer: Self-pay | Admitting: Pulmonary Disease

## 2018-08-21 VITALS — BP 142/80 | HR 93 | Ht 60.0 in | Wt 175.8 lb

## 2018-08-21 DIAGNOSIS — G4730 Sleep apnea, unspecified: Secondary | ICD-10-CM | POA: Diagnosis not present

## 2018-08-21 NOTE — Progress Notes (Signed)
Emma Curtis    [id number]    [birth year]  Primary Care Physician:Rumball, Jill Side, DO  Referring Physician: Ellwood Dense, DO [address] [place], [place] [address]  Chief complaint:  Known obstructive sleep apnea, defective machine  HPI:  Obstructive sleep apnea was diagnosed in 2005 Last machine was about 7 years ago Machine has become effective She did notice significant improvement in symptoms with use of CPAP therapy She is still compliant with use on a regular basis Comorbidities include hypertension She does have hypercholesterolemia  She is retired Never smoker  Outpatient Encounter Medications as of 08/21/2018  Medication Sig  . atorvastatin (LIPITOR) 40 MG tablet take 1 tablet by mouth once daily AT 6PM  . diclofenac (VOLTAREN) 75 MG EC tablet TAKE 1 TABLET BY MOUTH TWICE A DAY  . diclofenac sodium (VOLTAREN) 1 % GEL Apply 4 g topically 4 (four) times daily.  . fluticasone (FLONASE) 50 MCG/ACT nasal spray Place 1 spray into both nostrils daily.  . hydrochlorothiazide (HYDRODIURIL) 25 MG tablet TAKE 1 TABLET BY MOUTH ONCE DAILY  . losartan (COZAAR) 50 MG tablet Take 1 tablet (50 mg total) by mouth daily.  Marland Kitchen omeprazole (PRILOSEC) 40 MG capsule take 1 capsule by mouth once daily  . senna (SENOKOT) 8.6 MG TABS tablet Take 1 tablet (8.6 mg total) by mouth daily as needed for mild constipation.  . sodium chloride (OCEAN) 0.65 % SOLN nasal spray Place 1 spray into both nostrils as needed for congestion.  . [DISCONTINUED] cyclobenzaprine (FLEXERIL) 5 MG tablet Take 1 tablet (5 mg total) by mouth 3 (three) times daily as needed for muscle spasms.   No facility-administered encounter medications on file as of 08/21/2018.     Allergies as of 08/21/2018 - Review Complete 08/21/2018  Allergen Reaction Noted  . Lisinopril  05/14/2010    Past Medical History:  Diagnosis Date  . AOM (acute otitis media)    hx  of s/p tube right ear  . Arthritis    "hands" (11/26/2013)  . Cellulitis 11/2013   LLE  . Complication of anesthesia    "couldn't wake up after my first c-section" (11/26/2013)  . DVT (deep venous thrombosis) (HCC) 1989   immediately postpartum   . GERD (gastroesophageal reflux disease)   . HTN (hypertension)   . Hypertension   . Obstructive sleep apnea 01/09/03   ahi 79/hr  . OSA on CPAP   . Sjogren's syndrome (HCC)    cervical node  bx dr Annalee Genta    Past Surgical History:  Procedure Laterality Date  . ABDOMINAL HYSTERECTOMY  1996   "started laparoscopically; had to open me up; repair bladder; complete hysterectomy; left an ovary" (11/26/2013)  . BLADDER SURGERY  1996   "bladder ruptured during hysterectomy OR; repaired; wore ostomy for ~ 3 months" (11/26/2013)  . CARPAL TUNNEL RELEASE Bilateral 03/2003-05/2003  . CARPAL TUNNEL RELEASE Right 11/2003   "adjusted the 1st carpal tunnel release" (11/26/2013)  . CERVICAL BIOPSY     cervical node biopsy  . CESAREAN SECTION  1985; 1989  . DILATION AND CURETTAGE OF UTERUS    . FOOT SURGERY Left 2000's   "fat lodged between bone and tendon" (11/26/2013)  . LAPAROSCOPIC HYSTERECTOMY  12/12/94   bladder rupture complication  . MYRINGOTOMY WITH TUBE PLACEMENT Bilateral 07/2006  . OSTOMY TAKE DOWN  1997   "bladder ostomy" (11/26/2013)  . SALIVARY GLAND SURGERY Right 07/2006   biopsy/notes 08/02/2006 (11/26/2013)  . TUBAL LIGATION  1990    Family History  Problem Relation Age of Onset  . Heart defect Brother        died age 86 heart/valve dz  . Melanoma Sister        died age 72 melanoma/sarcoma  . Cancer Maternal Aunt        dm and cancer  . Dementia Father        died age 68  . Cancer Maternal Grandfather        colon ca died  . Dementia Paternal Grandmother        died dementia  . Breast cancer Neg Hx     Social History   Socioeconomic History  . Marital status: Married    Spouse name: Not on file  . Number of children: 2  . Years of education: Not on  file  . Highest education level: Not on file  Occupational History  . Occupation: Insurance underwriter  Social Needs  . Financial resource strain: Not on file  . Food insecurity:    Worry: Not on file    Inability: Not on file  . Transportation needs:    Medical: Not on file    Non-medical: Not on file  Tobacco Use  . Smoking status: Former Smoker    Packs/day: 1.00    Years: 23.00    Pack years: 23.00    Types: Cigarettes    Last attempt to quit: 10/12/1996    Years since quitting: 21.8  . Smokeless tobacco: Never Used  Substance and Sexual Activity  . Alcohol use: Yes    Comment: 11/26/2013 "2 shots vodka or rum/wk; none since 1984"  . Drug use: No  . Sexual activity: Yes  Lifestyle  . Physical activity:    Days per week: Not on file    Minutes per session: Not on file  . Stress: Not on file  Relationships  . Social connections:    Talks on phone: Not on file    Gets together: Not on file    Attends religious service: Not on file    Active member of club or organization: Not on file    Attends meetings of clubs or organizations: Not on file    Relationship status: Not on file  . Intimate partner violence:    Fear of current or ex partner: Not on file    Emotionally abused: Not on file    Physically abused: Not on file    Forced sexual activity: Not on file  Other Topics Concern  . Not on file  Social History Narrative  . Not on file    Review of Systems  Constitutional: Negative.   HENT: Negative.   Respiratory: Positive for apnea.   Psychiatric/Behavioral: Positive for sleep disturbance.  All other systems reviewed and are negative.   Vitals:   08/21/18 1402  BP: (!) 142/80  Pulse: 93  SpO2: 94%     Physical Exam  Constitutional: She is oriented to person, place, and time. She appears well-developed and well-nourished.  HENT:  Head: Normocephalic.  Eyes: Pupils are equal, round, and reactive to light. Conjunctivae and EOM are normal. Right eye exhibits  no discharge. Left eye exhibits no discharge.  Neck: Normal range of motion. Neck supple. No tracheal deviation present. No thyromegaly present.  Cardiovascular: Normal rate and regular rhythm.  Pulmonary/Chest: Effort normal. No respiratory distress. She has no wheezes.  Abdominal: Soft. Bowel sounds are normal. There is no tenderness.  Musculoskeletal: Normal range of motion.  She exhibits no edema or deformity.  Neurological: She is alert and oriented to person, place, and time. She has normal reflexes. No cranial nerve deficit.  Skin: Skin is warm and dry. No rash noted. No erythema.  Psychiatric: She has a normal mood and affect.   Data Reviewed: Recent records reviewed  Assessment:  Obstructive sleep apnea  Patient with known obstructive sleep apnea, compliant with treatment Did note significant improvement in symptoms with treatment- machine is currently dysfunctional   Plan/Recommendations: Obtain a home sleep study  Treatment options discussed, pathophysiology discussed  I will see patient in about 2 months   Virl Diamond MD [facility] 08/21/2018, 2:38 PM  CC: Ellwood Dense, DO

## 2018-08-21 NOTE — Patient Instructions (Signed)
Patient with known obstructive sleep apnea, compliant with treatment  Machine has become defective  Weight has not significantly changed over time, CPAP is very effective and helpful  We will try and get testing done to obtain a new CPAP device  As the diagnosis was made many years ago, study will be done to ascertain that you still have significant sleep disordered breathing and continue treatment

## 2018-08-22 NOTE — Addendum Note (Signed)
Addended by: Sylvester Harder on: 08/22/2018 09:02 AM   Modules accepted: Orders

## 2018-08-25 ENCOUNTER — Other Ambulatory Visit: Payer: Self-pay | Admitting: Family Medicine

## 2018-09-14 ENCOUNTER — Other Ambulatory Visit: Payer: Self-pay | Admitting: Family Medicine

## 2018-09-14 DIAGNOSIS — E785 Hyperlipidemia, unspecified: Secondary | ICD-10-CM

## 2018-09-19 ENCOUNTER — Encounter (HOSPITAL_BASED_OUTPATIENT_CLINIC_OR_DEPARTMENT_OTHER): Payer: BC Managed Care – PPO

## 2018-11-06 IMAGING — US US SOFT TISSUE HEAD/NECK
1 series · 12 of 25 positions shown · non-contrast
Comparison: CTA of the chest on 11/23/2017

CLINICAL DATA: Incidental on CT. Bilateral thyroid nodules detected
by CT.

EXAM:
THYROID ULTRASOUND
TECHNIQUE: Ultrasound examination of the thyroid gland and adjacent soft
tissues was performed.

[Series 1: us soft tissue head/neck · 0.09mm/px · 63 acquisitions, 12 frames shown]
[im 3/63]
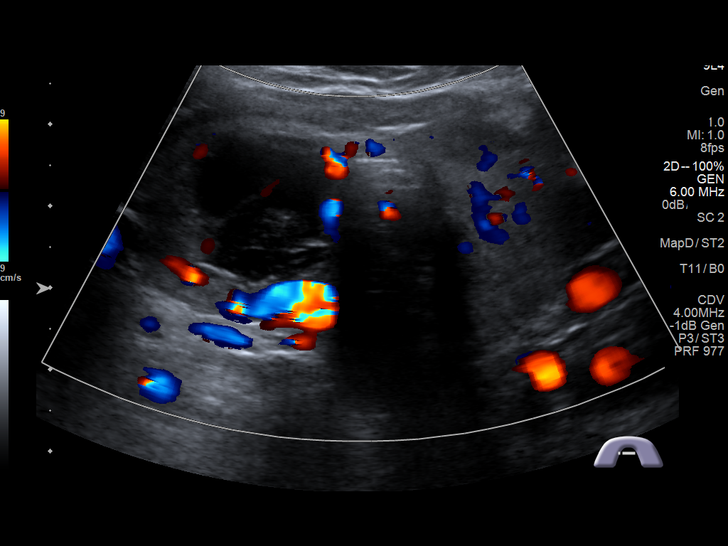
[im 8/63]
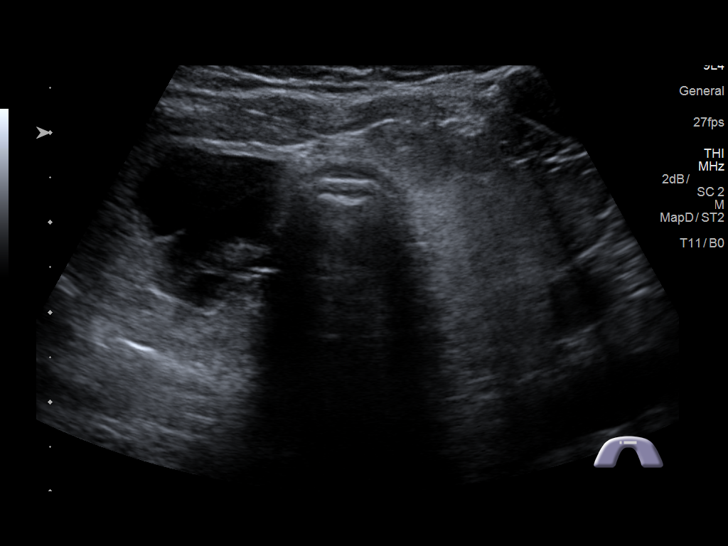
[im 13/63]
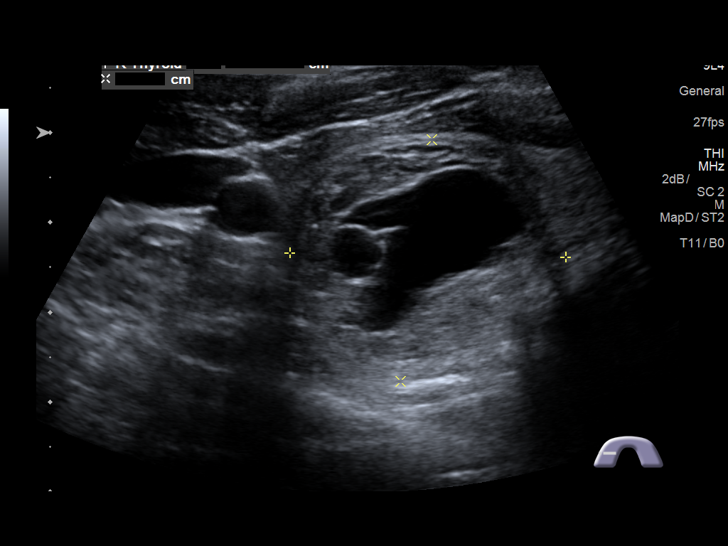
[im 19/63]
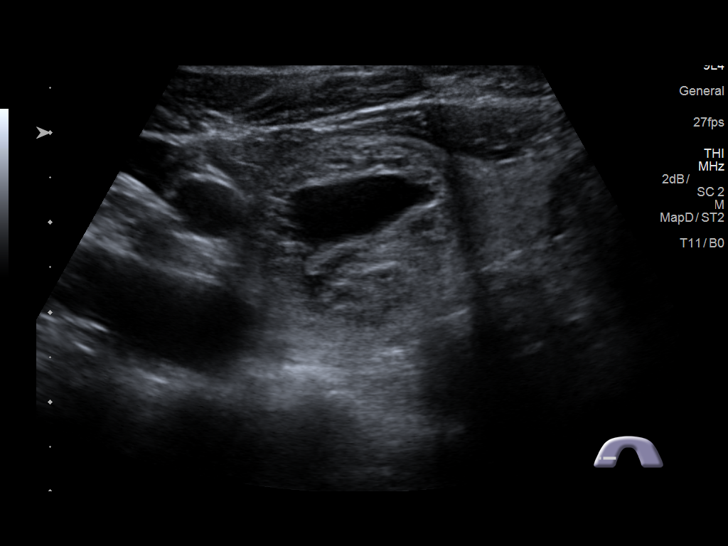
[im 24/63]
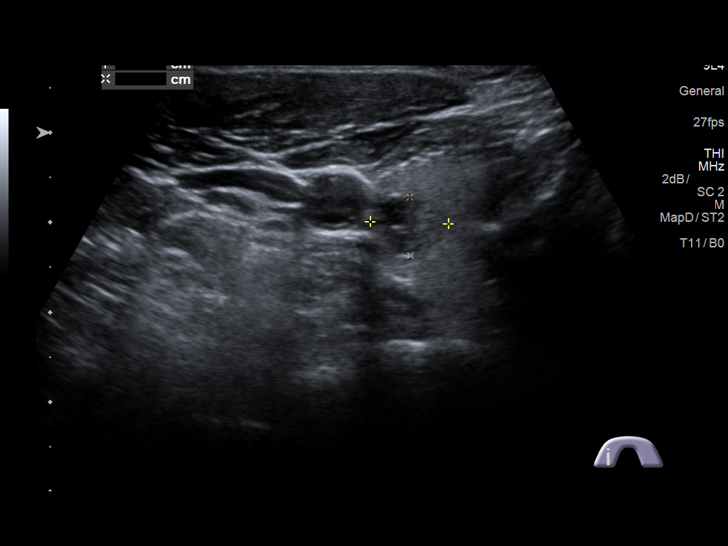
[im 29/63]
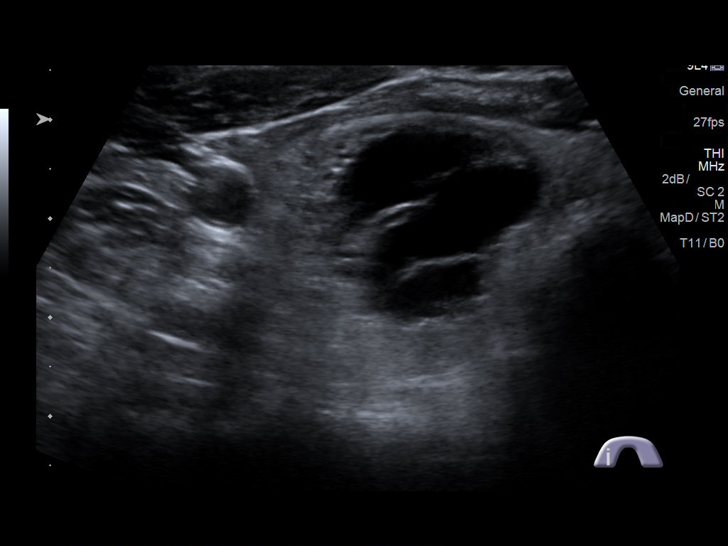
[im 34/63]
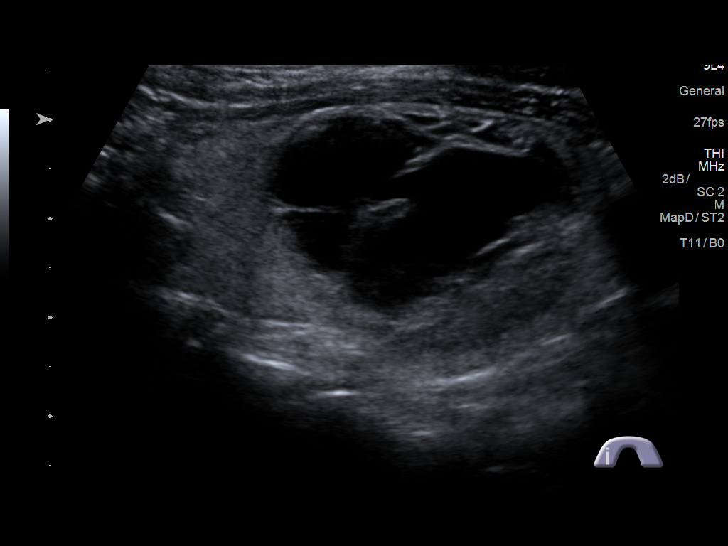
[im 39/63]
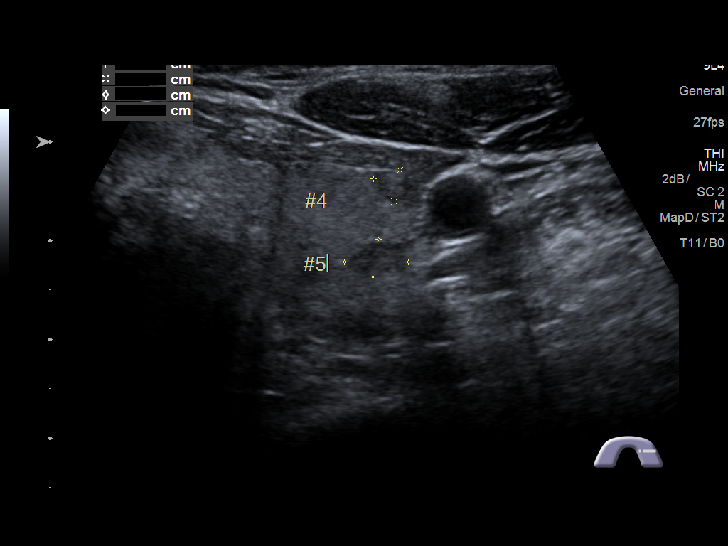
[im 44/63]
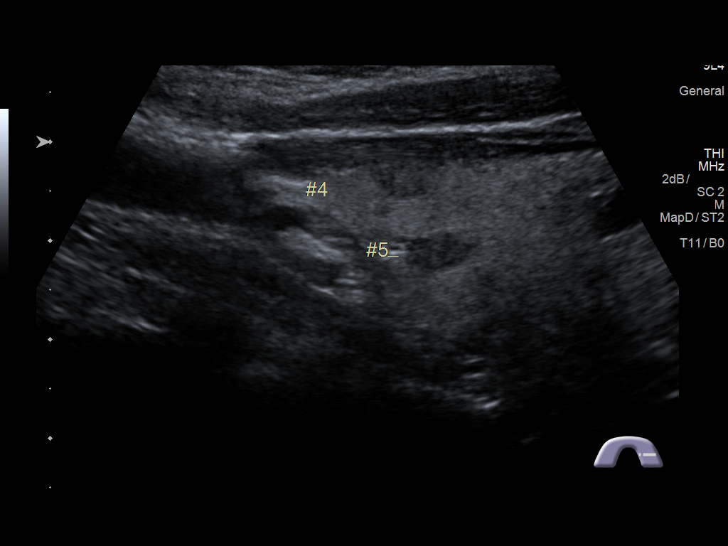
[im 50/63]
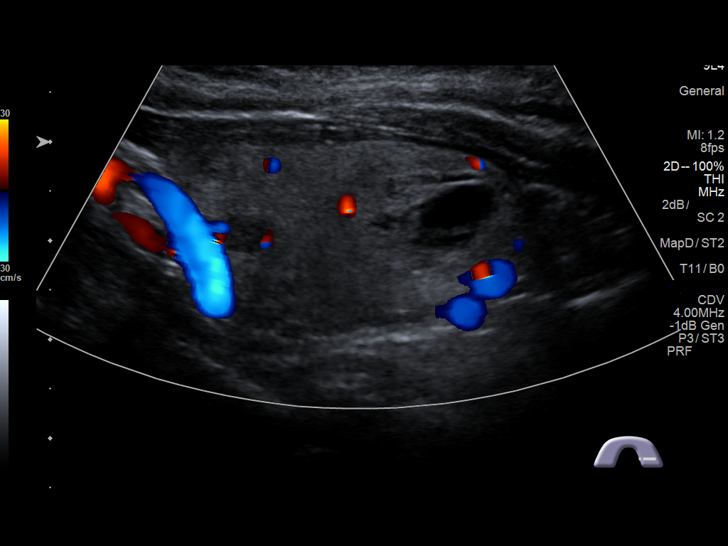
[im 55/63]
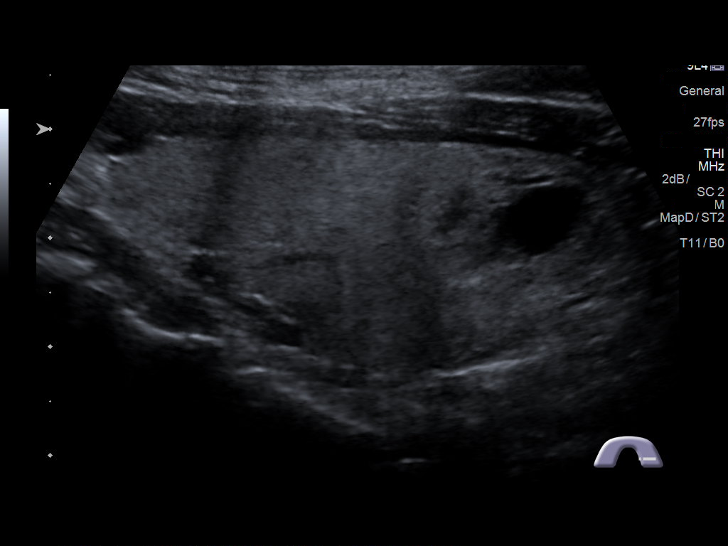
[im 60/63]
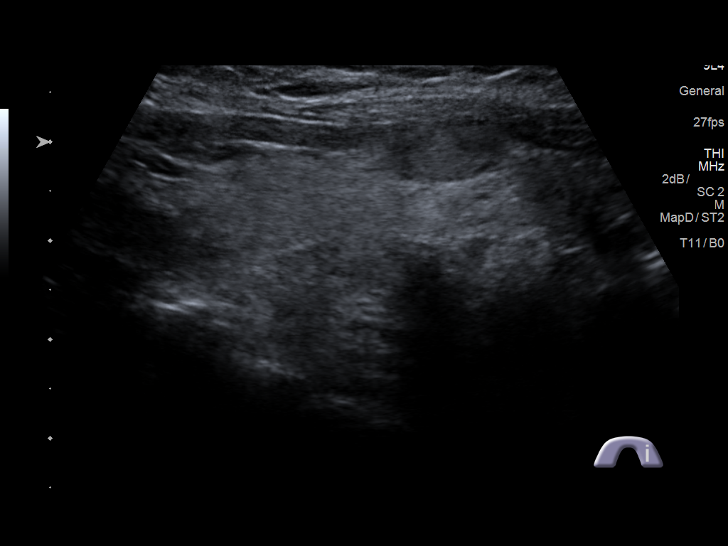

[12 of 25 positions shown; findings below may reference images not displayed]

FINDINGS: Parenchymal Echotexture: Mildly heterogenous

Isthmus: 0.8 cm

Right lobe: 6.0 x 2.7 x 2.9 cm

Left lobe: 5.0 x 1.8 x 2.0 cm

_________________________________________________________

Estimated total number of nodules >/= 1 cm: 3

Number of spongiform nodules >/=  2 cm not described below (TR1): 0

Number of mixed cystic and solid nodules >/= 1.5 cm not described
below (TR2): 0

_________________________________________________________

Nodule # 1:

Location: Isthmus

Maximum size: 1.3 cm; Other 2 dimensions: 0.6 x 0.9 cm

Composition: solid/almost completely solid (2)

Echogenicity: hypoechoic (2)

Shape: not taller-than-wide (0)

Margins: smooth (0)

Echogenic foci: none (0)

ACR TI-RADS total points: 4.

ACR TI-RADS risk category: TR4 (4-6 points).

ACR TI-RADS recommendations:

*Given size (>/= 1 - 1.4 cm) and appearance, a follow-up ultrasound
in 1 year should be considered based on TI-RADS criteria.

_________________________________________________________

Nodule # 2:

Location: Right; Mid

Maximum size: 3.1 cm; Other 2 dimensions: 2.6 x 3.1 cm

Composition: mixed cystic and solid (1)

Echogenicity: isoechoic (1)

Shape: not taller-than-wide (0)

Margins: ill-defined (0)

Echogenic foci: none (0)

ACR TI-RADS total points: 2.

ACR TI-RADS risk category: TR2 (2 points).

ACR TI-RADS recommendations:

This nodule does NOT meet TI-RADS criteria for biopsy or dedicated
follow-up.

_________________________________________________________

Nodule # 3:

Location: Right; Superior

Maximum size: 0.9 cm; Other 2 dimensions: 0.7 x 0.8 cm

Composition: mixed cystic and solid (1)

Echogenicity: isoechoic (1)

Shape: not taller-than-wide (0)

Margins: ill-defined (0)

Echogenic foci: none (0)

ACR TI-RADS total points: 2.

ACR TI-RADS risk category: TR2 (2 points).

ACR TI-RADS recommendations:

This nodule does NOT meet TI-RADS criteria for biopsy or dedicated
follow-up.

_________________________________________________________

Nodule # 4:

Location: Left; Inferior

Maximum size: 1.9 cm; Other 2 dimensions: 1.8 x 1.9 cm

Composition: mixed cystic and solid (1)

Echogenicity: isoechoic (1)

Shape: not taller-than-wide (0)

Margins: ill-defined (0)

Echogenic foci: none (0)

ACR TI-RADS total points: 2.

ACR TI-RADS risk category: TR2 (2 points).

ACR TI-RADS recommendations:

This nodule does NOT meet TI-RADS criteria for biopsy or dedicated
follow-up.

_________________________________________________________

No enlarged or abnormal appearing lymph nodes identified.
IMPRESSION: Multinodular thyroid goiter. A 1.3 cm solid, hypoechoic isthmus
nodule meets criteria for 1 year follow-up ultrasound. Other solid
and cystic nodules in right and left lobes are of low suspicion and
do not meet criteria for biopsy or further follow-up.

The above is in keeping with the ACR TI-RADS recommendations - [HOSPITAL] 0825;[DATE].

## 2018-11-23 ENCOUNTER — Other Ambulatory Visit: Payer: Self-pay | Admitting: Family Medicine

## 2019-05-22 ENCOUNTER — Other Ambulatory Visit: Payer: Self-pay | Admitting: Family Medicine

## 2019-08-27 ENCOUNTER — Other Ambulatory Visit: Payer: Self-pay | Admitting: Family Medicine

## 2019-10-29 ENCOUNTER — Other Ambulatory Visit: Payer: Self-pay | Admitting: Family Medicine

## 2019-10-29 NOTE — Telephone Encounter (Signed)
Refill sent. Please have patient follow up soon for her BP. Thanks!

## 2019-10-29 NOTE — Telephone Encounter (Signed)
Spoke with pt. Informed that Rx was refilled. Pt stated that she would call back at a later time to make appt for follow up on BP. Salvatore Marvel, CMA

## 2019-11-18 ENCOUNTER — Other Ambulatory Visit: Payer: Self-pay | Admitting: Family Medicine

## 2019-11-18 DIAGNOSIS — E785 Hyperlipidemia, unspecified: Secondary | ICD-10-CM

## 2019-11-18 NOTE — Telephone Encounter (Signed)
Refill sent.  Please have patient make appointment for follow-up for hypertension and health maintenance items.

## 2019-12-18 ENCOUNTER — Other Ambulatory Visit: Payer: Self-pay | Admitting: Family Medicine

## 2019-12-29 ENCOUNTER — Other Ambulatory Visit: Payer: Self-pay | Admitting: Family Medicine

## 2019-12-30 NOTE — Telephone Encounter (Signed)
Patient is aware that she needs an appointment and will call back to make an appointment.  Emma Curtis,CMA

## 2020-01-17 ENCOUNTER — Other Ambulatory Visit: Payer: Self-pay | Admitting: Family Medicine

## 2020-01-17 NOTE — Telephone Encounter (Signed)
Called patient to schedule follow up appointment. Pt states that she will call the office back to schedule appointment, as she is out of town at this time.   Veronda Prude, RN

## 2020-03-14 ENCOUNTER — Ambulatory Visit: Payer: BC Managed Care – PPO

## 2020-03-23 ENCOUNTER — Other Ambulatory Visit: Payer: Self-pay | Admitting: Family Medicine

## 2020-03-24 NOTE — Telephone Encounter (Signed)
Appt made.  Dontario Evetts,CMA  

## 2020-04-10 ENCOUNTER — Ambulatory Visit: Payer: BC Managed Care – PPO | Admitting: Family Medicine

## 2020-04-27 ENCOUNTER — Other Ambulatory Visit: Payer: Self-pay | Admitting: Family Medicine

## 2020-04-28 NOTE — Telephone Encounter (Signed)
Attempted to reach pt to make f/up appt. Female that answered the phone stated that the pt was sleep. I told her that I would call back later to make that appt. Aquilla Solian, CMA

## 2020-04-28 NOTE — Telephone Encounter (Signed)
Spoke with pt she stated that she is feeling a little better from her COVID symptoms. Pt stated that she will call to make appt once  she is cleared. Aquilla Solian, CMA

## 2020-06-16 ENCOUNTER — Other Ambulatory Visit: Payer: Self-pay | Admitting: Family Medicine

## 2020-06-16 NOTE — Telephone Encounter (Signed)
Patient just switched to my service since Dr. Linwood Dibbles, former PCP, graduated. She requests a refill of losartan for H TN. Last BMP was 2018 and showed mildly low potassium at 3.3. Will refill for this month, but patient needs a follow up with BMP to check electrolytes, cr before future refills.  Shirlean Mylar, MD [facility], PGY-2

## 2020-06-17 NOTE — Telephone Encounter (Signed)
LM ok per dpr with message from MD.  Asked patient to call back and make an appointment for her blood pressure follow up.  Froilan Mclean,CMA

## 2020-07-17 ENCOUNTER — Other Ambulatory Visit: Payer: Self-pay | Admitting: Family Medicine

## 2020-07-17 DIAGNOSIS — I10 Essential (primary) hypertension: Secondary | ICD-10-CM

## 2020-07-17 NOTE — Telephone Encounter (Signed)
Patient has not been seen in our clinic since January 2019, last labs from 2018. Will refill medications as a courtesy this time, but patient needs a physical exam and labs to receive future refills.  Shirlean Mylar, MD [facility], PGY-2

## 2020-07-20 ENCOUNTER — Telehealth: Payer: Self-pay

## 2020-07-20 NOTE — Telephone Encounter (Signed)
Attempted to reach pt. No answer. LVM for pt to call the office to schedule a physical for any future refills. Aquilla Solian, CMA

## 2020-07-22 NOTE — Telephone Encounter (Signed)
Attempted to reach pt. No answer. LVM for pt to call the office to schedule a physical before any other future refills can be done. Aquilla Solian, CMA

## 2020-08-11 ENCOUNTER — Other Ambulatory Visit: Payer: Self-pay | Admitting: Family Medicine

## 2020-08-11 DIAGNOSIS — I10 Essential (primary) hypertension: Secondary | ICD-10-CM

## 2020-08-12 ENCOUNTER — Encounter: Payer: Self-pay | Admitting: Family Medicine

## 2020-08-12 NOTE — Telephone Encounter (Signed)
Patient has not been seen in our clinic since early 2019, no labs since late 2018. Will refill labs x 1 month as a courtesy, but for any future refills she must be seen in our clinic and get BMP.  Shirlean Mylar, MD [facility], PGY-2

## 2020-10-13 ENCOUNTER — Other Ambulatory Visit: Payer: Self-pay | Admitting: Family Medicine

## 2020-10-13 DIAGNOSIS — I10 Essential (primary) hypertension: Secondary | ICD-10-CM

## 2020-10-13 NOTE — Telephone Encounter (Signed)
Patient has not been seen in this office for labs since 2018. Received a refill request today, patient was called in September and had a my chart message that wasn't viewed regarding inability to authorize future refills as she hasn't been seen recently. Discussed with patient on the phone that she moved to another town and has established at another practice, but refill was mistakenly sent to [facility]. She will clarify with with pharmacy. Ok to remove from patient list.   Shirlean Mylar, MD [facility], PGY-2

## 2020-10-13 NOTE — Telephone Encounter (Signed)
Done. Thank you.

## 2021-04-30 ENCOUNTER — Other Ambulatory Visit: Payer: Self-pay | Admitting: Family Medicine

## 2021-04-30 DIAGNOSIS — I10 Essential (primary) hypertension: Secondary | ICD-10-CM

## 2021-04-30 NOTE — Telephone Encounter (Signed)
Received medication refill request. Patient established at another practice, see previous notes from summer/fall 2021. Never seen by me. Decline refill request.  Shirlean Mylar, MD [facility], PGY-2

## 2022-02-04 ENCOUNTER — Other Ambulatory Visit: Payer: Self-pay | Admitting: Obstetrics and Gynecology

## 2022-02-04 DIAGNOSIS — Z1231 Encounter for screening mammogram for malignant neoplasm of breast: Secondary | ICD-10-CM

## 2022-02-22 ENCOUNTER — Other Ambulatory Visit: Payer: Self-pay

## 2022-02-22 ENCOUNTER — Ambulatory Visit
Admission: RE | Admit: 2022-02-22 | Discharge: 2022-02-22 | Disposition: A | Discharge status: Home / Self Care | Payer: Medicare HMO | Source: Ambulatory Visit | Attending: Obstetrics and Gynecology | Admitting: Obstetrics and Gynecology

## 2022-02-22 DIAGNOSIS — Z1231 Encounter for screening mammogram for malignant neoplasm of breast: Secondary | ICD-10-CM

## 2022-09-20 IMAGING — MG MM DIGITAL SCREENING BILAT W/ TOMO AND CAD
8 series · 8 of 24 positions shown · non-contrast
Comparison: Previous exam(s).

CLINICAL DATA: Screening.

EXAM:
DIGITAL SCREENING BILATERAL MAMMOGRAM WITH TOMOSYNTHESIS AND CAD
TECHNIQUE: Bilateral screening digital craniocaudal and mediolateral oblique
mammograms were obtained. Bilateral screening digital breast
tomosynthesis was performed. The images were evaluated with
computer-aided detection.

[L CC synth-2D]
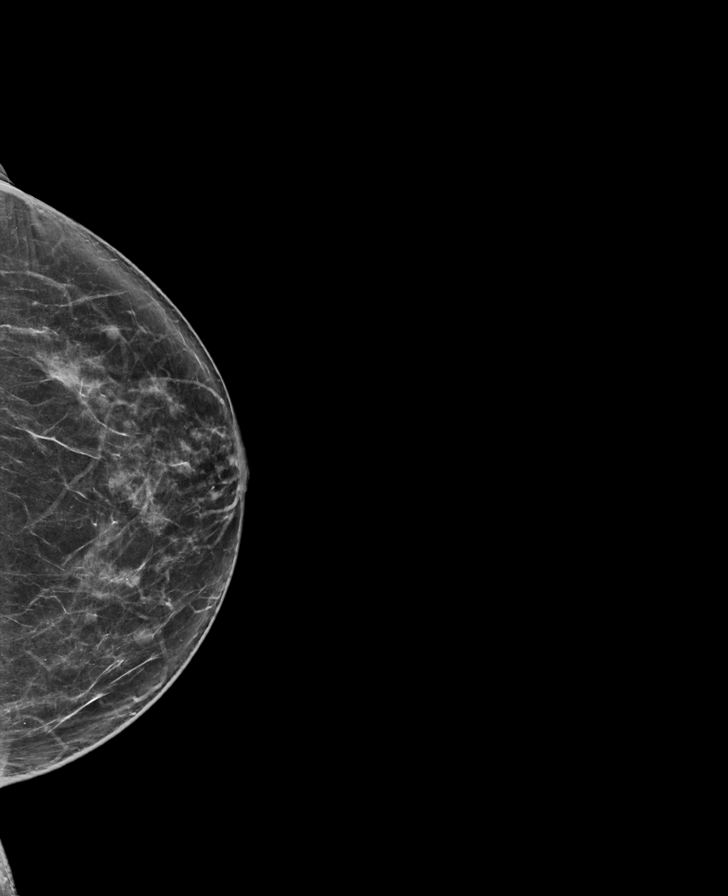

[R MLO synth-2D]
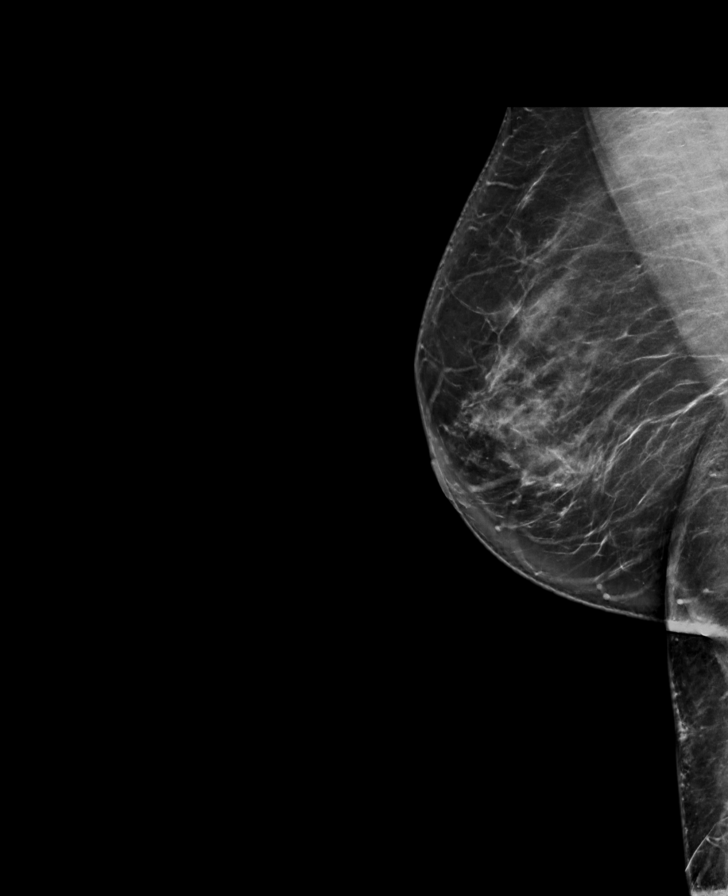

[L MLO synth-2D]
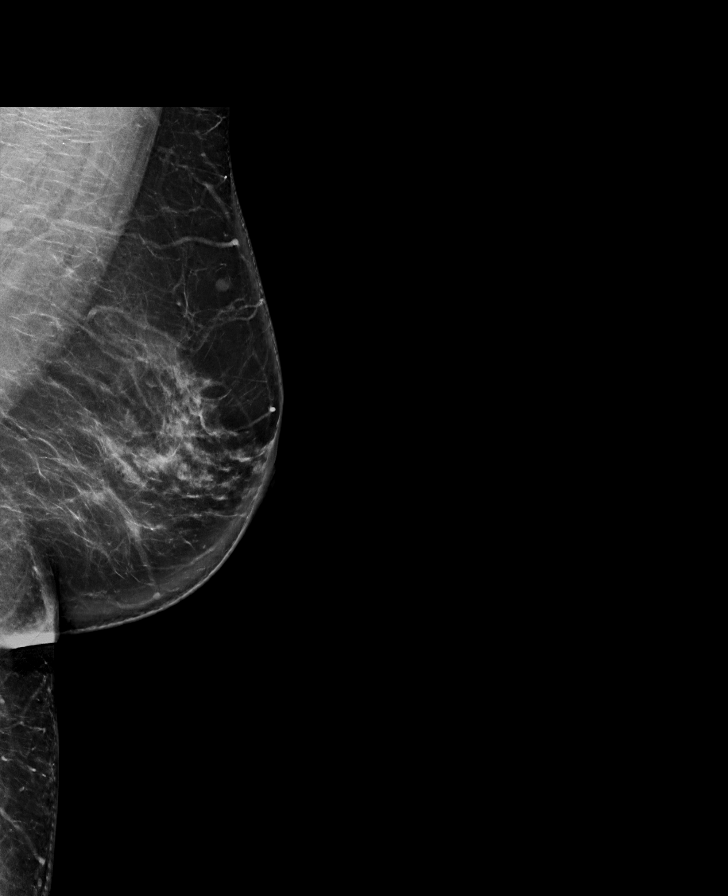

[R CC synth-2D]
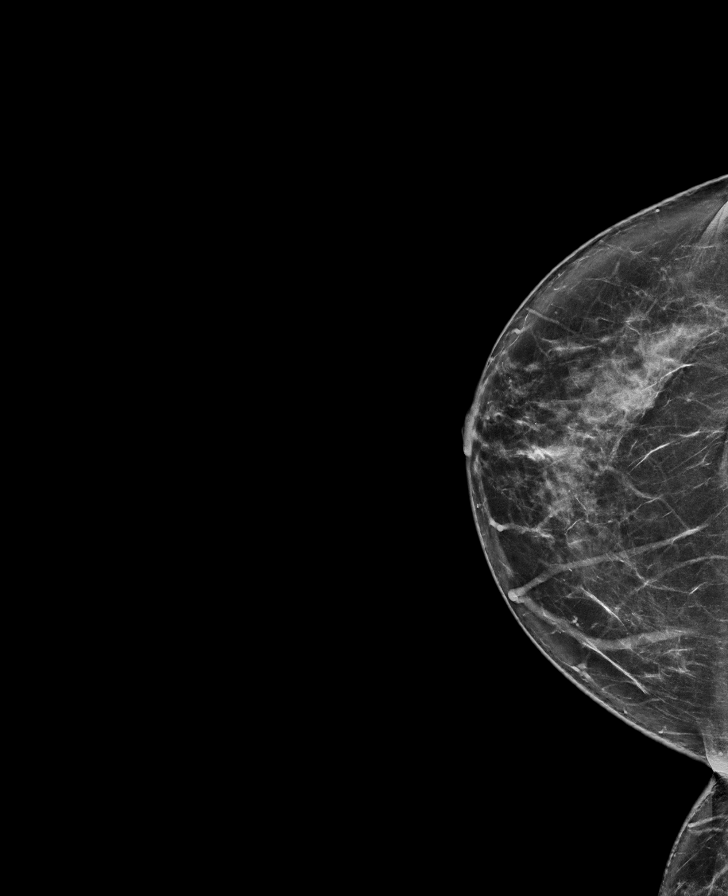

[L MLO tomo · tomo slice 45/89.0]
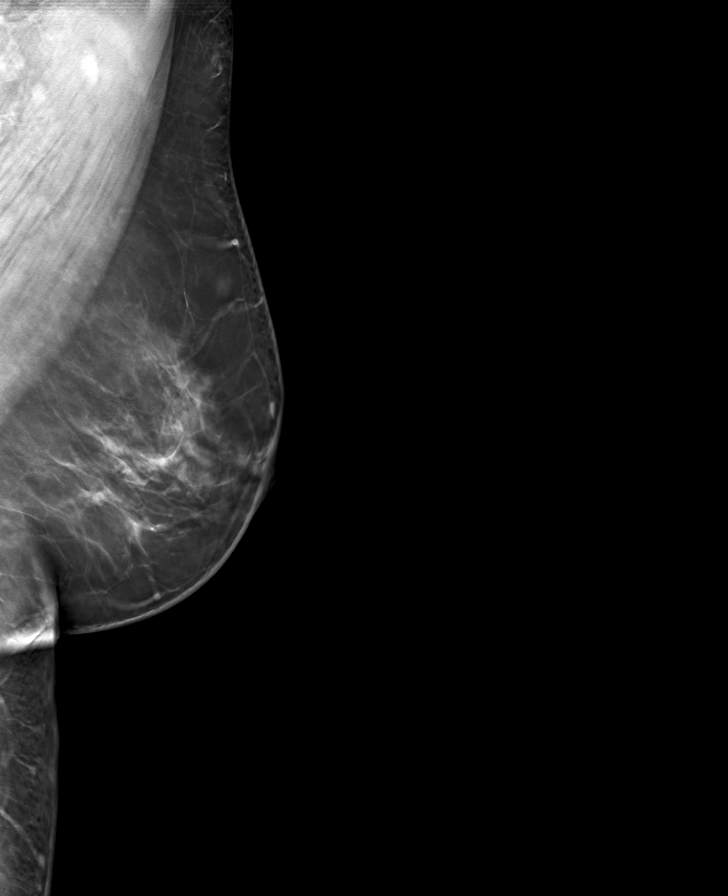

[R CC tomo · tomo slice 39/78.0]
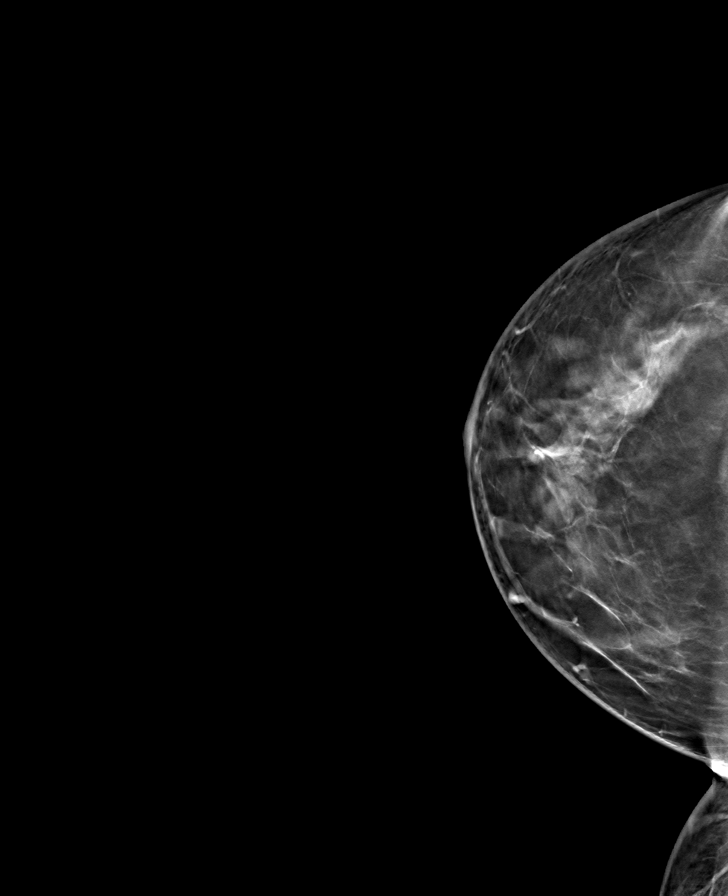

[R MLO tomo · tomo slice 44/87.0]
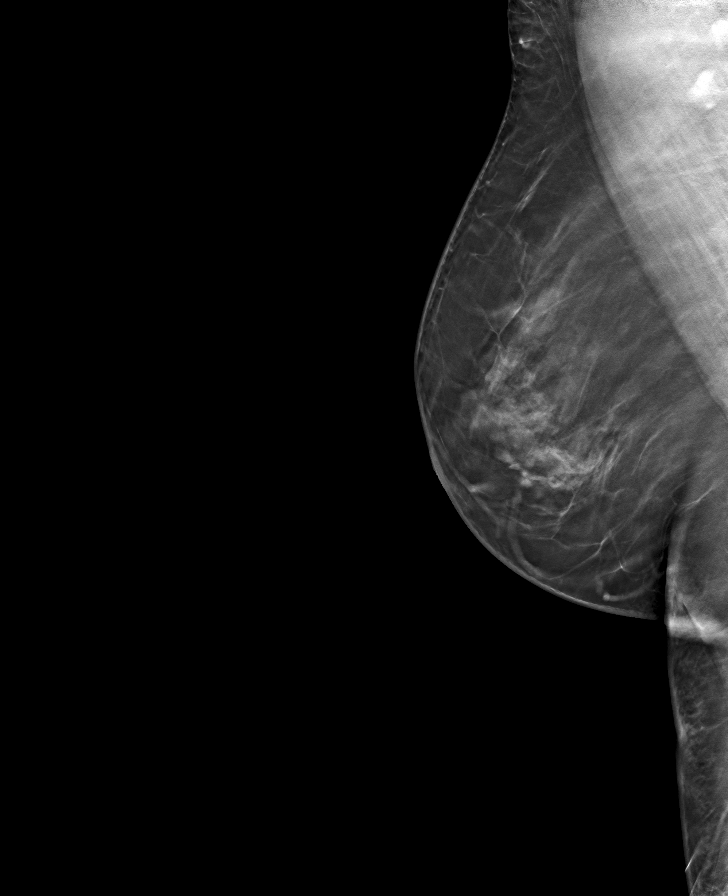

[L CC tomo · tomo slice 39/78.0]
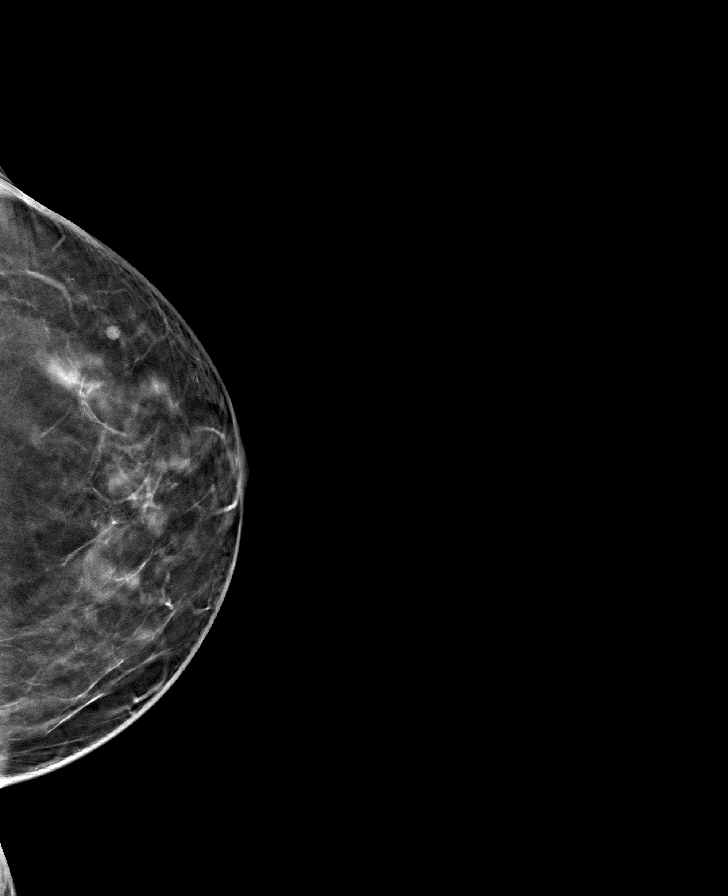

[8 of 24 positions shown; findings below may reference images not displayed]

ACR Breast Density Category b: There are scattered areas of
fibroglandular density.
FINDINGS: There are no findings suspicious for malignancy.
IMPRESSION: No mammographic evidence of malignancy. A result letter of this
screening mammogram will be mailed directly to the patient.

RECOMMENDATION:
Screening mammogram in one year. (Code:51-O-LD2)

BI-RADS CATEGORY  1: Negative.
